# Patient Record
Sex: Male | Born: 1965
Health system: Southern US, Community
[De-identification: ages and names within clinical notes are randomized; demographics above are authoritative.]

## PROBLEM LIST (undated history)

## (undated) DIAGNOSIS — N529 Male erectile dysfunction, unspecified: Secondary | ICD-10-CM

## (undated) DIAGNOSIS — N201 Calculus of ureter: Secondary | ICD-10-CM

## (undated) HISTORY — DX: Male erectile dysfunction, unspecified: N52.9

## (undated) HISTORY — PX: TONSILLECTOMY AND ADENOIDECTOMY: SHX28

---

## 2001-07-05 HISTORY — PX: COLONOSCOPY: SHX174

## 2007-07-06 HISTORY — PX: NASAL TURBINATE REDUCTION: SHX2072

## 2012-07-05 DIAGNOSIS — Z87442 Personal history of urinary calculi: Secondary | ICD-10-CM | POA: Insufficient documentation

## 2012-08-18 ENCOUNTER — Ambulatory Visit: Payer: BC Managed Care – PPO | Admitting: Family Medicine

## 2012-10-16 ENCOUNTER — Encounter: Payer: Self-pay | Admitting: Family Medicine

## 2012-10-16 ENCOUNTER — Ambulatory Visit (INDEPENDENT_AMBULATORY_CARE_PROVIDER_SITE_OTHER): Payer: BC Managed Care – PPO | Admitting: Family Medicine

## 2012-10-16 VITALS — BP 110/76 | HR 72 | Temp 97.7°F | Ht 73.0 in | Wt 198.2 lb

## 2012-10-16 DIAGNOSIS — Z800 Family history of malignant neoplasm of digestive organs: Secondary | ICD-10-CM | POA: Insufficient documentation

## 2012-10-16 DIAGNOSIS — Z87442 Personal history of urinary calculi: Secondary | ICD-10-CM

## 2012-10-16 NOTE — Assessment & Plan Note (Signed)
Last colonoscopy 2007 WNL per pt. Will recommend rpt at age 47yo.

## 2012-10-16 NOTE — Patient Instructions (Signed)
Good to meet you today, call us with questions. Return as needed or in August/Sept for fasting blood work and afterwards for physical.

## 2012-10-16 NOTE — Progress Notes (Signed)
Subjective:    Patient ID: Clarence Moore, male    DOB: 02/03/1966, 47 y.o.   MRN: [id number]  HPI CC: new pt to establish  Prior saw [facility].  Overall very healthy.  No concerns today.  Sees ENT in [facility].  H/o recurrent sinus infections.  S/p nasal turbinate reduction.  Significant improvement in recurrent injections and subjective improvement in breathing.  Sees Dr. Margo Aye derm - h/o sun damage on skin of back as child.  Preventative: Due for physical in fall. Colonoscopy 2007 for fmhx.  Normal. Tetanus - unsure. Flu shot - has not received recently.  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  Past histories reviewed and updated if relevant as below. There is no problem list on file for this patient.  Past Medical History  Diagnosis Date  . History of kidney stones 07/2012  . History of chicken pox    Past Surgical History  Procedure Laterality Date  . Tonsillectomy and adenoidectomy    . Nasal turbinate reduction  2009  . Colonoscopy  2006    WNL [facility])   History  Substance Use Topics  . Smoking status: Never Smoker   . Smokeless tobacco: Never Used  . Alcohol Use: No   Family History  Problem Relation Age of Onset  . Cancer Paternal Grandmother 7    colon  . Colon polyps Paternal Grandfather   . Colon polyps Father   . Cancer Father     skin  . Cancer Mother 71    lung, non small cell (smoker)  . Diabetes Neg Hx   . CAD Neg Hx   . Stroke Neg Hx   . Cancer Father     esophageal   Allergies  Allergen Reactions  . Penicillins Anaphylaxis   No current outpatient prescriptions on file prior to visit.   No current facility-administered medications on file prior to visit.     Review of  Systems  Constitutional: Negative for fever, chills, activity change, appetite change, fatigue and unexpected weight change.  HENT: Negative for hearing loss and neck pain.   Eyes: Negative for visual disturbance.  Respiratory: Negative for cough, chest tightness, shortness of breath and wheezing.   Cardiovascular: Negative for chest pain, palpitations and leg swelling.  Gastrointestinal: Negative for nausea, vomiting, abdominal pain, diarrhea, constipation, blood in stool and abdominal distention.  Genitourinary: Negative for hematuria and difficulty urinating.  Musculoskeletal: Negative for myalgias and arthralgias.  Skin: Negative for rash.  Neurological: Negative for dizziness, seizures, syncope and headaches.  Hematological: Does not bruise/bleed easily.  Psychiatric/Behavioral: Negative for dysphoric mood. The patient is not nervous/anxious.        Objective:   Physical Exam  Nursing note and vitals reviewed. Constitutional: He is oriented to person, place, and time. He appears well-developed and well-nourished. No distress.  HENT:  Head: Normocephalic and atraumatic.  Right Ear: External ear normal.  Left Ear: External ear normal.  Nose: Nose normal.  Mouth/Throat: Oropharynx is clear and moist. No oropharyngeal exudate.  Eyes: Conjunctivae and EOM are normal. Pupils are equal, round, and reactive to light. No scleral icterus.  Neck: Normal range of motion. Neck supple. No thyromegaly present.  Cardiovascular: Normal rate, regular rhythm, normal heart sounds and intact distal pulses.   No murmur heard. Pulses:  Radial pulses are 2+ on the right side, and 2+ on the left side.  Pulmonary/Chest: Effort normal and breath sounds normal. No respiratory distress. He has no wheezes. He has no rales.  Musculoskeletal: Normal range of motion. He exhibits no edema.  Lymphadenopathy:    He has no cervical adenopathy.  Neurological: He is alert and oriented to person, place, and  time.  CN grossly intact, station and gait intact  Skin: Skin is warm and dry. No rash noted.  Multiple moles throughout body  Psychiatric: He has a normal mood and affect. His behavior is normal. Judgment and thought content normal.       Assessment & Plan:

## 2012-10-16 NOTE — Assessment & Plan Note (Signed)
Discussed methods to decrease risk of kidney stones.

## 2012-10-27 ENCOUNTER — Encounter: Payer: Self-pay | Admitting: Internal Medicine

## 2012-10-27 ENCOUNTER — Telehealth: Payer: Self-pay | Admitting: Family Medicine

## 2012-10-27 ENCOUNTER — Ambulatory Visit (INDEPENDENT_AMBULATORY_CARE_PROVIDER_SITE_OTHER): Payer: BC Managed Care – PPO | Admitting: Internal Medicine

## 2012-10-27 VITALS — BP 110/70 | HR 80 | Temp 98.0°F | Wt 200.0 lb

## 2012-10-27 DIAGNOSIS — J019 Acute sinusitis, unspecified: Secondary | ICD-10-CM

## 2012-10-27 MED ORDER — HYDROCODONE-HOMATROPINE 5-1.5 MG/5ML PO SYRP: 5.0000 mL | ORAL_SOLUTION | Freq: Every evening | ORAL | Status: DC | PRN

## 2012-10-27 MED ORDER — AZITHROMYCIN 250 MG PO TABS: ORAL_TABLET | ORAL | Status: DC

## 2012-10-27 NOTE — Telephone Encounter (Signed)
Will evaluate at OV 

## 2012-10-27 NOTE — Assessment & Plan Note (Signed)
Clearly has secondary bacterial infection PCN allergic Will give z-pak---if doesn't respond, would try clinda

## 2012-10-27 NOTE — Telephone Encounter (Signed)
Patient Information:  Caller Name: [place]  Phone: [phone]  Patient: Clarence, Moore  Gender: Male  DOB: 1966-03-26  Age: 47 Years  PCP: Crawford Givens Clelia Croft) [facility])  Office Follow Up:  Does the office need to follow up with this patient?: No  Instructions For The Office: N/A  RN Note:  pt also reports  chest pain with coughing  Symptoms  Reason For Call & Symptoms: chest congestion for 1 week; productive cough x 3 days  Reviewed Health History In EMR: Yes  Reviewed Medications In EMR: Yes  Reviewed Allergies In EMR: Yes  Reviewed Surgeries / Procedures: Yes  Date of Onset of Symptoms: 10/24/2012  Treatments Tried: Zyrtec  Treatments Tried Worked: No  Guideline(s) Used:  Sinus Pain and Congestion  Disposition Per Guideline:   See Today or Tomorrow in Office  Reason For Disposition Reached:   Sinus congestion (pressure, fullness) present > 10 days  Advice Given:  N/A  Patient Will Follow Care Advice:  YES  Appointment Scheduled:  10/27/2012 10:15:00 Appointment Scheduled Provider:  [facility])  (pt was not sure who his primary provider was, last visit was with Dr Reece Agar.  Dr Reece Agar is unavailable today, so appt made with first available provider)

## 2012-10-27 NOTE — Progress Notes (Signed)
  Subjective:    Patient ID: Clarence Moore, male    DOB: Dec 29, 1965, 47 y.o.   MRN: [id number]  HPI Started with typical allergy symptoms Using zyrtec  Now for the last 3-4 days Now with bad right frontal pain Can't sleep due to persistent harsh cough Heaviness and tight feeling in upper chest with cough  Head congestion with post nasal drip Copious purulent sputum with cough in AM  No fever Slight SOB feeling ("like I have a weight") Slight night wheezing  No other meds besides the zyrtec Uses sinus rinse  Current Outpatient Prescriptions on File Prior to Visit  Medication Sig Dispense Refill  . cetirizine (ZYRTEC) 10 MG tablet Take 10 mg by mouth daily as needed for allergies.       No current facility-administered medications on file prior to visit.    Allergies  Allergen Reactions  . Penicillins Anaphylaxis    Past Medical History  Diagnosis Date  . History of kidney stones 07/2012  . History of chicken pox     Past Surgical History  Procedure Laterality Date  . Tonsillectomy and adenoidectomy    . Nasal turbinate reduction  2009  . Colonoscopy  2006    WNL ([place])    Family History  Problem Relation Age of Onset  . Cancer Paternal Grandmother 57    colon  . Colon polyps Paternal Grandfather   . Colon polyps Father   . Cancer Mother 16    lung, non small cell (smoker)  . Diabetes Neg Hx   . CAD Neg Hx   . Stroke Neg Hx   . Cancer Father     skin/esophageal  . Alzheimer's disease Paternal Grandfather     History   Social History  . Marital Status: Married    Spouse Name: N/A    Number of Children: N/A  . Years of Education: N/A   Occupational History  . Not on file.   Social History Main Topics  . Smoking status: Never Smoker   . Smokeless tobacco: Never Used  . Alcohol Use: No  . Drug Use: No  . Sexually Active: Not on file   Other Topics Concern  . Not on file   Social History Narrative   Caffeine: 2 cups coffee/day   Lives  with wife, daughter, outdoor pets   Occupation: distribution Research scientist (life sciences)   Edu: college   Activity: cuts wood, dirtbike, walks at work   Diet: good water, fruits/vegetables daily   Review of Systems No rash No vomiting or diarrhea Appetite is okay    Objective:   Physical Exam  Constitutional: He appears well-developed and well-nourished. No distress.  HENT:  TMs normal Mild right frontal tenderness Slight pharyngeal injection without exudate Marked nasal inflammation and some thick mucus  Neck: Normal range of motion. Neck supple.  Pulmonary/Chest: Effort normal and breath sounds normal. No respiratory distress. He has no wheezes. He has no rales.  Lymphadenopathy:    He has no cervical adenopathy.          Assessment & Plan:

## 2012-11-02 DIAGNOSIS — N201 Calculus of ureter: Secondary | ICD-10-CM

## 2012-11-02 HISTORY — PX: LITHOTRIPSY: SUR834

## 2012-11-02 HISTORY — DX: Calculus of ureter: N20.1

## 2012-11-10 ENCOUNTER — Other Ambulatory Visit: Payer: Self-pay | Admitting: Family Medicine

## 2012-11-10 ENCOUNTER — Ambulatory Visit (INDEPENDENT_AMBULATORY_CARE_PROVIDER_SITE_OTHER): Payer: BC Managed Care – PPO | Admitting: Family Medicine

## 2012-11-10 ENCOUNTER — Ambulatory Visit: Payer: BC Managed Care – PPO | Admitting: Internal Medicine

## 2012-11-10 ENCOUNTER — Ambulatory Visit (INDEPENDENT_AMBULATORY_CARE_PROVIDER_SITE_OTHER)
Admission: RE | Admit: 2012-11-10 | Discharge: 2012-11-10 | Disposition: A | Discharge status: Home / Self Care | Payer: BC Managed Care – PPO | Source: Ambulatory Visit | Attending: Family Medicine | Admitting: Family Medicine

## 2012-11-10 ENCOUNTER — Encounter: Payer: Self-pay | Admitting: Family Medicine

## 2012-11-10 DIAGNOSIS — R1030 Lower abdominal pain, unspecified: Secondary | ICD-10-CM | POA: Insufficient documentation

## 2012-11-10 DIAGNOSIS — R109 Unspecified abdominal pain: Secondary | ICD-10-CM

## 2012-11-10 DIAGNOSIS — N132 Hydronephrosis with renal and ureteral calculous obstruction: Secondary | ICD-10-CM

## 2012-11-10 LAB — POCT URINALYSIS DIPSTICK
Bilirubin, UA: NEGATIVE
Ketones, UA: NEGATIVE
Spec Grav, UA: 1.01
pH, UA: 8

## 2012-11-10 LAB — BASIC METABOLIC PANEL
BUN: 22 mg/dL (ref 6–23)
Chloride: 104 mEq/L (ref 96–112)
GFR: 65.12 mL/min (ref >60.00)
Potassium: 3.9 mEq/L (ref 3.5–5.1)
Sodium: 138 mEq/L (ref 135–145)

## 2012-11-10 LAB — CBC WITH DIFFERENTIAL/PLATELET
Basophils Relative: 0.3 % (ref 0.0–3.0)
Eosinophils Relative: 0.4 % (ref 0.0–5.0)
HCT: 40.8 % (ref 39.0–52.0)
Lymphs Abs: 1.4 10*3/uL (ref 0.7–4.0)
MCV: 90.1 fl (ref 78.0–100.0)
Monocytes Absolute: 0.5 10*3/uL (ref 0.1–1.0)
Monocytes Relative: 3.6 % (ref 3.0–12.0)
Platelets: 303 10*3/uL (ref 150.0–400.0)
RBC: 4.54 Mil/uL (ref 4.22–5.81)
WBC: 13.9 10*3/uL — ABNORMAL HIGH (ref 4.5–10.5)

## 2012-11-10 MED ORDER — CIPROFLOXACIN HCL 500 MG PO TABS: 500.0000 mg | ORAL_TABLET | Freq: Two times a day (BID) | ORAL | Status: DC

## 2012-11-10 MED ORDER — TAMSULOSIN HCL 0.4 MG PO CAPS: 0.4000 mg | ORAL_CAPSULE | Freq: Every day | ORAL | Status: DC

## 2012-11-10 MED ORDER — KETOROLAC TROMETHAMINE 30 MG/ML IJ SOLN: 30.0000 mg | Freq: Once | INTRAMUSCULAR | Status: DC

## 2012-11-10 MED ORDER — NAPROXEN 500 MG PO TABS: ORAL_TABLET | ORAL | Status: DC

## 2012-11-10 MED ORDER — KETOROLAC TROMETHAMINE 30 MG/ML IM SOLN: 30.0000 mg | Freq: Once | INTRAMUSCULAR | Status: DC

## 2012-11-10 MED ORDER — PROMETHAZINE HCL 25 MG RE SUPP: 25.0000 mg | Freq: Four times a day (QID) | RECTAL | Status: DC | PRN

## 2012-11-10 MED ORDER — PROMETHAZINE HCL 25 MG PO TABS: 25.0000 mg | ORAL_TABLET | Freq: Three times a day (TID) | ORAL | Status: DC | PRN

## 2012-11-10 MED ORDER — KETOROLAC TROMETHAMINE 30 MG/ML IJ SOLN
30.0000 mg | Freq: Once | INTRAMUSCULAR | Status: AC
  Administered 2012-11-10: 30 mg via INTRAMUSCULAR

## 2012-11-10 NOTE — Patient Instructions (Addendum)
Urine checked today. Shot of toradol 30mg  today. Sent home with naprosyn twice daily with food for 5 days, flomax daily for 5-7 days, and phenergan for nausea as needed. May use vicodin for breakthrough pain Blood work today. Pass by Marion's office for CT scan today if able. If worsening pain instead of improving with above, or if fever, persistent vomiting, or inability to void, please go to ER. Return to see me early next week if not improved with above.

## 2012-11-10 NOTE — Progress Notes (Signed)
  Subjective:    Patient ID: Clarence Moore, male    DOB: 05-May-1966, 47 y.o.   MRN: [id number]  HPI CC: inability to void  H/o kidney stones, last was 1 month ago but never passed stone.    Started feeling ill at 8:30am.  Lower abd pain, twisting pain, penile pain.  Unable to void.  Drinking lots of water.  Vomited prior to arrival x1.  Mild left flank pain.  Last void was 4:30am.  No fevers/chills.  No diarrhea.  Has never seen urologist.  No h/o uro imaging.  No results found for this basename: CREATININE   Past Medical History  Diagnosis Date  . History of kidney stones 07/2012  . History of chicken pox    Past Surgical History  Procedure Laterality Date  . Tonsillectomy and adenoidectomy    . Nasal turbinate reduction  2009  . Colonoscopy  2006    WNL ([place])    Review of Systems Per HPI    Objective:   Physical Exam  Nursing note and vitals reviewed. Constitutional: He appears well-developed and well-nourished. No distress.  uncomfortable  Abdominal: Soft. Normal appearance and bowel sounds are normal. He exhibits no distension and no mass. There is no hepatosplenomegaly. There is tenderness in the right lower quadrant, suprapubic area and left lower quadrant. There is no rigidity, no rebound, no guarding, no CVA tenderness and negative Murphy's sign.  Musculoskeletal: He exhibits no edema.  Skin: He is not diaphoretic.       Assessment & Plan:

## 2012-11-10 NOTE — Assessment & Plan Note (Addendum)
In h/o kidney stones, associated with trouble voiding and hematuria, nausea. UA - mod blood, small LE.  Micro: mainly RBC, no WBC.  Doubt infection but will send UCx to help r/o Check CT without contrast to eval size of kidney stones and r/p obstruction.. Treat with shot of toradol, then home with naprosyn, phenergan, and flomax.  Check blood work today to help r/o infection/obstruction. Red flags to seek ER discussed. Close f/u merited.  Pt agrees with plan.

## 2012-11-13 ENCOUNTER — Other Ambulatory Visit: Payer: Self-pay | Admitting: Urology

## 2012-11-16 ENCOUNTER — Encounter (HOSPITAL_BASED_OUTPATIENT_CLINIC_OR_DEPARTMENT_OTHER): Payer: Self-pay | Admitting: *Deleted

## 2012-11-17 ENCOUNTER — Encounter (HOSPITAL_BASED_OUTPATIENT_CLINIC_OR_DEPARTMENT_OTHER): Payer: Self-pay | Admitting: *Deleted

## 2012-11-17 NOTE — Progress Notes (Signed)
NPO AFTER MN WITH EXCEPTION WATER/ GATORADE UNTIL 0700. ARRIVES AT 1145. CURRENT LAB WORK FROM 11-10-2012 IN EPIC AND CHART. MAY TAKE OXYCODONE / PHENERGAN IF NEEDED W/ SIPS OF WATER AM OF SURG.

## 2012-11-20 ENCOUNTER — Encounter (HOSPITAL_BASED_OUTPATIENT_CLINIC_OR_DEPARTMENT_OTHER): Payer: Self-pay | Admitting: *Deleted

## 2012-11-20 ENCOUNTER — Ambulatory Visit (HOSPITAL_COMMUNITY): Payer: BC Managed Care – PPO

## 2012-11-20 ENCOUNTER — Ambulatory Visit (HOSPITAL_BASED_OUTPATIENT_CLINIC_OR_DEPARTMENT_OTHER): Payer: BC Managed Care – PPO | Admitting: Anesthesiology

## 2012-11-20 ENCOUNTER — Encounter (HOSPITAL_BASED_OUTPATIENT_CLINIC_OR_DEPARTMENT_OTHER)
Admission: RE | Disposition: A | Discharge status: Home / Self Care | Payer: Self-pay | Source: Ambulatory Visit | Attending: Urology

## 2012-11-20 ENCOUNTER — Ambulatory Visit (HOSPITAL_BASED_OUTPATIENT_CLINIC_OR_DEPARTMENT_OTHER)
Admission: RE | Admit: 2012-11-20 | Discharge: 2012-11-20 | Disposition: A | Discharge status: Home / Self Care | Payer: BC Managed Care – PPO | Source: Ambulatory Visit | Attending: Urology | Admitting: Urology

## 2012-11-20 ENCOUNTER — Encounter (HOSPITAL_BASED_OUTPATIENT_CLINIC_OR_DEPARTMENT_OTHER): Payer: Self-pay | Admitting: Anesthesiology

## 2012-11-20 DIAGNOSIS — Z880 Allergy status to penicillin: Secondary | ICD-10-CM | POA: Insufficient documentation

## 2012-11-20 DIAGNOSIS — N201 Calculus of ureter: Secondary | ICD-10-CM | POA: Insufficient documentation

## 2012-11-20 HISTORY — DX: Calculus of ureter: N20.1

## 2012-11-20 HISTORY — PX: CYSTOSCOPY WITH RETROGRADE PYELOGRAM, URETEROSCOPY AND STENT PLACEMENT: SHX5789

## 2012-11-20 HISTORY — PX: HOLMIUM LASER APPLICATION: SHX5852

## 2012-11-20 SURGERY — CYSTOURETEROSCOPY, WITH RETROGRADE PYELOGRAM AND STENT INSERTION
Anesthesia: General | Site: Ureter | Laterality: Left | Wound class: Clean Contaminated

## 2012-11-20 MED ORDER — LIDOCAINE HCL 2 % EX GEL
CUTANEOUS | Status: DC | PRN
  Administered 2012-11-20: 1 via URETHRAL

## 2012-11-20 MED ORDER — NEOSTIGMINE METHYLSULFATE 1 MG/ML IJ SOLN
INTRAMUSCULAR | Status: DC | PRN
  Administered 2012-11-20: 4 mg via INTRAVENOUS

## 2012-11-20 MED ORDER — LACTATED RINGERS IV SOLN
INTRAVENOUS | Status: DC
  Administered 2012-11-20 (×3): via INTRAVENOUS
  Filled 2012-11-20: qty 1000

## 2012-11-20 MED ORDER — PHENAZOPYRIDINE HCL 200 MG PO TABS
200.0000 mg | ORAL_TABLET | Freq: Three times a day (TID) | ORAL | Status: DC
  Administered 2012-11-20: 200 mg via ORAL
  Filled 2012-11-20: qty 1

## 2012-11-20 MED ORDER — OXYCODONE-ACETAMINOPHEN 5-325 MG PO TABS: 1.0000 | ORAL_TABLET | ORAL | Status: DC | PRN

## 2012-11-20 MED ORDER — ROCURONIUM BROMIDE 100 MG/10ML IV SOLN
INTRAVENOUS | Status: DC | PRN
  Administered 2012-11-20: 10 mg via INTRAVENOUS
  Administered 2012-11-20: 5 mg via INTRAVENOUS
  Administered 2012-11-20: 10 mg via INTRAVENOUS

## 2012-11-20 MED ORDER — SODIUM CHLORIDE 0.9 % IR SOLN
Status: DC | PRN
  Administered 2012-11-20: 6000 mL via INTRAVESICAL

## 2012-11-20 MED ORDER — HYOSCYAMINE SULFATE 0.125 MG SL SUBL
0.1250 mg | SUBLINGUAL_TABLET | SUBLINGUAL | Status: DC | PRN
  Filled 2012-11-20: qty 1

## 2012-11-20 MED ORDER — FENTANYL CITRATE 0.05 MG/ML IJ SOLN
INTRAMUSCULAR | Status: DC | PRN
  Administered 2012-11-20: 50 ug via INTRAVENOUS
  Administered 2012-11-20: 25 ug via INTRAVENOUS
  Administered 2012-11-20: 50 ug via INTRAVENOUS
  Administered 2012-11-20: 25 ug via INTRAVENOUS

## 2012-11-20 MED ORDER — LIDOCAINE HCL 4 % MT SOLN
OROMUCOSAL | Status: DC | PRN
  Administered 2012-11-20: 4 mL via TOPICAL

## 2012-11-20 MED ORDER — FENTANYL CITRATE 0.05 MG/ML IJ SOLN
25.0000 ug | INTRAMUSCULAR | Status: DC | PRN
  Filled 2012-11-20: qty 1

## 2012-11-20 MED ORDER — SUCCINYLCHOLINE CHLORIDE 20 MG/ML IJ SOLN
INTRAMUSCULAR | Status: DC | PRN
  Administered 2012-11-20: 200 mg via INTRAVENOUS

## 2012-11-20 MED ORDER — CIPROFLOXACIN IN D5W 400 MG/200ML IV SOLN
400.0000 mg | INTRAVENOUS | Status: AC
  Administered 2012-11-20: 400 mg via INTRAVENOUS
  Filled 2012-11-20: qty 200

## 2012-11-20 MED ORDER — ONDANSETRON HCL 4 MG/2ML IJ SOLN
INTRAMUSCULAR | Status: DC | PRN
  Administered 2012-11-20: 4 mg via INTRAVENOUS

## 2012-11-20 MED ORDER — MEPERIDINE HCL 25 MG/ML IJ SOLN
6.2500 mg | INTRAMUSCULAR | Status: DC | PRN
  Filled 2012-11-20: qty 1

## 2012-11-20 MED ORDER — IOHEXOL 350 MG/ML SOLN
INTRAVENOUS | Status: DC | PRN
  Administered 2012-11-20: 5 mL

## 2012-11-20 MED ORDER — HYOSCYAMINE SULFATE 0.125 MG PO TABS: 0.1250 mg | ORAL_TABLET | ORAL | Status: DC | PRN

## 2012-11-20 MED ORDER — LACTATED RINGERS IV SOLN
INTRAVENOUS | Status: DC
  Filled 2012-11-20: qty 1000

## 2012-11-20 MED ORDER — PROPOFOL 10 MG/ML IV BOLUS
INTRAVENOUS | Status: DC | PRN
  Administered 2012-11-20: 300 mg via INTRAVENOUS

## 2012-11-20 MED ORDER — CIPROFLOXACIN HCL 500 MG PO TABS: 500.0000 mg | ORAL_TABLET | Freq: Two times a day (BID) | ORAL | Status: DC

## 2012-11-20 MED ORDER — DEXAMETHASONE SODIUM PHOSPHATE 4 MG/ML IJ SOLN
INTRAMUSCULAR | Status: DC | PRN
  Administered 2012-11-20: 8 mg via INTRAVENOUS

## 2012-11-20 MED ORDER — OXYCODONE-ACETAMINOPHEN 5-325 MG PO TABS
1.0000 | ORAL_TABLET | ORAL | Status: DC | PRN
  Administered 2012-11-20: 1 via ORAL
  Filled 2012-11-20: qty 1

## 2012-11-20 MED ORDER — PHENAZOPYRIDINE HCL 100 MG PO TABS: 100.0000 mg | ORAL_TABLET | Freq: Three times a day (TID) | ORAL | Status: DC | PRN

## 2012-11-20 MED ORDER — PROMETHAZINE HCL 25 MG/ML IJ SOLN
6.2500 mg | INTRAMUSCULAR | Status: DC | PRN
  Filled 2012-11-20: qty 1

## 2012-11-20 MED ORDER — GLYCOPYRROLATE 0.2 MG/ML IJ SOLN
INTRAMUSCULAR | Status: DC | PRN
  Administered 2012-11-20: 0.6 mg via INTRAVENOUS

## 2012-11-20 MED ORDER — OXYBUTYNIN CHLORIDE 5 MG PO TABS: 5.0000 mg | ORAL_TABLET | Freq: Four times a day (QID) | ORAL | Status: DC | PRN

## 2012-11-20 MED ORDER — SENNOSIDES-DOCUSATE SODIUM 8.6-50 MG PO TABS: 1.0000 | ORAL_TABLET | Freq: Two times a day (BID) | ORAL | Status: DC

## 2012-11-20 MED ORDER — LIDOCAINE HCL (CARDIAC) 20 MG/ML IV SOLN
INTRAVENOUS | Status: DC | PRN
  Administered 2012-11-20: 75 mg via INTRAVENOUS

## 2012-11-20 MED ORDER — MIDAZOLAM HCL 5 MG/5ML IJ SOLN
INTRAMUSCULAR | Status: DC | PRN
  Administered 2012-11-20 (×2): 1 mg via INTRAVENOUS

## 2012-11-20 SURGICAL SUPPLY — 38 items
ADAPTER CATH URET PLST 4-6FR (CATHETERS) ×3 IMPLANT
BAG DRAIN URO-CYSTO SKYTR STRL (DRAIN) ×3 IMPLANT
BASKET LASER NITINOL 1.9FR (BASKET) IMPLANT
BASKET STNLS GEMINI 4WIRE 3FR (BASKET) IMPLANT
BASKET ZERO TIP NITINOL 2.4FR (BASKET) IMPLANT
BRUSH URET BIOPSY 3F (UROLOGICAL SUPPLIES) IMPLANT
CANISTER SUCT LVC 12 LTR MEDI- (MISCELLANEOUS) ×3 IMPLANT
CATH CLEAR GEL 3F BACKSTOP (CATHETERS) ×3 IMPLANT
CATH INTERMIT  6FR 70CM (CATHETERS) IMPLANT
CATH URET 5FR 28IN CONE TIP (BALLOONS)
CATH URET 5FR 28IN OPEN ENDED (CATHETERS) ×3 IMPLANT
CATH URET 5FR 70CM CONE TIP (BALLOONS) IMPLANT
CATH URET DUAL LUMEN 6-10FR 50 (CATHETERS) IMPLANT
CLOTH BEACON ORANGE TIMEOUT ST (SAFETY) ×3 IMPLANT
DRAPE CAMERA CLOSED 9X96 (DRAPES) ×3 IMPLANT
ELECT REM PT RETURN 9FT ADLT (ELECTROSURGICAL)
ELECTRODE REM PT RTRN 9FT ADLT (ELECTROSURGICAL) IMPLANT
GLOVE BIO SURGEON STRL SZ7 (GLOVE) ×3 IMPLANT
GLOVE ECLIPSE 7.0 STRL STRAW (GLOVE) ×3 IMPLANT
GLOVE INDICATOR 7.5 STRL GRN (GLOVE) ×3 IMPLANT
GOWN PREVENTION PLUS LG XLONG (DISPOSABLE) ×3 IMPLANT
GUIDEWIRE 0.038 PTFE COATED (WIRE) IMPLANT
GUIDEWIRE ANG ZIPWIRE 038X150 (WIRE) IMPLANT
GUIDEWIRE STR DUAL SENSOR (WIRE) ×3 IMPLANT
IV NS IRRIG 3000ML ARTHROMATIC (IV SOLUTION) ×6 IMPLANT
KIT BALLIN UROMAX 15FX10 (LABEL) IMPLANT
KIT BALLN UROMAX 15FX4 (MISCELLANEOUS) IMPLANT
KIT BALLN UROMAX 26 75X4 (MISCELLANEOUS)
LASER FIBER DISP (UROLOGICAL SUPPLIES) ×6 IMPLANT
PACK CYSTOSCOPY (CUSTOM PROCEDURE TRAY) ×3 IMPLANT
SET HIGH PRES BAL DIL (LABEL)
SHEATH ACCESS URETERAL 38CM (SHEATH) ×3 IMPLANT
SHEATH ACCESS URETERAL 54CM (SHEATH) IMPLANT
SHEATH URET ACCESS 12FR/35CM (UROLOGICAL SUPPLIES) IMPLANT
SHEATH URET ACCESS 12FR/55CM (UROLOGICAL SUPPLIES) IMPLANT
STENT CONTOUR 6FRX28X.038 (STENTS) IMPLANT
STENT URET 6FRX28 CONTOUR (STENTS) ×3 IMPLANT
SYRINGE IRR TOOMEY STRL 70CC (SYRINGE) ×3 IMPLANT

## 2012-11-20 NOTE — Op Note (Signed)
Urology Operative Report  Date of Procedure: 11/20/12  Surgeon: Natalia Leatherwood, MD Assistant:  None  Preoperative Diagnosis: Left distal ureter stone. Postoperative Diagnosis:  Same  Procedure(s): Cystoscopy Left ureteroscopy Laser lithotripsy Left ureter stent placement Fluoroscopy  Estimated blood loss: Minimal  Specimen: Stones sent to [facility] lab for analysis.  Drains: None  Complications: None  Findings: Left distal ureter stone with distal ureteral edema and stenosis.  History of present illness: Patient presents today for left distal ureter stone. He was having pain from the stone and elected intervention. He presents today for ureteroscopy.   Procedure in detail: After informed consent was obtained, the patient was taken to the operating room. They were placed in the supine position. SCDs were turned on and in place. IV antibiotics were infused, and general anesthesia was induced. A timeout was performed in which the correct patient, surgical site, and procedure were identified and agreed upon by the team.  The patient was placed in a dorsolithotomy position, making sure to pad all pertinent neurovascular pressure points. A belladonna and opium suppository was placed into the rectum. The genitals were prepped and draped in the usual sterile fashion.  I placed 10 cc of lidocaine jelly into the urethra. I then passed a rigid cystoscope into the bladder. The bladder was drained. I then fully distended the bladder with sterile fluid and evaluated the bladder in a systematic fashion to fully visualize the entire surface of the bladder. There were no tumors. Attention was turned to the left ureter orifice. It was cannulated with a sensor tip wire. This is placed up to the left renal pelvis on fluoroscopy. This was secured to the drape as a safety wire. The patient was paralyzed. I then advanced a semirigid ureteroscope through the urethra into the bladder and attempted to place  this into the distal ureter. This was very narrow and noted to be stenotic. The cystoscope was removed. I then placed the obturator to a 12/14 ureter access sheath over the safety wire under fluoroscopy. This was placed very gently and this passed with ease. This was passed only into the distal ureter. This was then removed and the safety wire was secured to the drape. I then reinserted the semirigid ureter scope and this time is able to pass this into the ureter and the stone was visualized. The stone was too large to remove. I then placed backstop gel beyond the stone under direct visualization. I then perform lithotripsy with a 200  holmium laser filament. I used the settings of 0.5 J and 20 Hz. Lithotripsy was used to break up stone into the fragments were small enough to be removed. I then used a 0 tip Nitinol basket to remove the large fragments from the bladder and the ureter. The smaller fragments I placed into the bladder. Due to the distal stenosis I felt it would be appropriate to place a ureter stent. The ureter scope was removed and the cystoscope was loaded onto the safety wire. A 6 x 28 double-J ureter stent was placed over the safety wire into the left renal pelvis. This was deployed with a good curl in the left renal pelvis on fluoroscopy and a good curl in the bladder. The bladder was then drained and the larger stone fragments were washed out of the bladder. I then placed an additional 10 cc of lidocaine jelly into the urethra. The string was then secured to the patient's penis with tape.  The patient was placed back in supine  position, anesthesia was reversed, and he was taken to the [facility] in stable condition. The stones will be sent to [facility] for chemical analysis. He will remove the stent himself early on Thursday morning.  All counts were correct at the end of the case.

## 2012-11-20 NOTE — Anesthesia Preprocedure Evaluation (Addendum)
Anesthesia Evaluation  Patient identified by MRN, date of birth, ID band Patient awake    Reviewed: Allergy & Precautions, H&P , NPO status , Patient's Chart, lab work & pertinent test results  Airway Mallampati: II TM Distance: >3 FB Neck ROM: full    Dental no notable dental hx.    Pulmonary neg pulmonary ROS,  breath sounds clear to auscultation  Pulmonary exam normal       Cardiovascular Exercise Tolerance: Good negative cardio ROS  Rhythm:regular Rate:Normal     Neuro/Psych negative neurological ROS  negative psych ROS   GI/Hepatic negative GI ROS, Neg liver ROS,   Endo/Other  negative endocrine ROS  Renal/GU negative Renal ROS  negative genitourinary   Musculoskeletal negative musculoskeletal ROS (+)   Abdominal   Peds negative pediatric ROS (+)  Hematology negative hematology ROS (+)   Anesthesia Other Findings   Reproductive/Obstetrics negative OB ROS                          Anesthesia Physical Anesthesia Plan  ASA: I  Anesthesia Plan: General LMA   Post-op Pain Management:    Induction: Intravenous  Airway Management Planned: LMA  Additional Equipment:   Intra-op Plan:   Post-operative Plan: Extubation in OR  Informed Consent: I have reviewed the patients History and Physical, chart, labs and discussed the procedure including the risks, benefits and alternatives for the proposed anesthesia with the patient or authorized representative who has indicated his/her understanding and acceptance.   Dental Advisory Given  Plan Discussed with: CRNA  Anesthesia Plan Comments:        Anesthesia Quick Evaluation

## 2012-11-20 NOTE — Anesthesia Postprocedure Evaluation (Signed)
  Anesthesia Post-op Note  Patient: Clarence Moore  Procedure(s) Performed: Procedure(s) (LRB): CYSTOSCOPY WITH RETROGRADE PYELOGRAM, URETEROSCOPY AND STENT PLACEMENT (Left) HOLMIUM LASER APPLICATION (Left)  Patient Location: PACU  Anesthesia Type: General  Level of Consciousness: awake and alert   Airway and Oxygen Therapy: Patient Spontanous Breathing  Post-op Pain: mild  Post-op Assessment: Post-op Vital signs reviewed, Patient's Cardiovascular Status Stable, Respiratory Function Stable, Patent Airway and No signs of Nausea or vomiting  Last Vitals:  Filed Vitals:   11/20/12 1515  BP: 123/70  Pulse: 58  Temp:   Resp: 15    Post-op Vital Signs: stable   Complications: No apparent anesthesia complications

## 2012-11-20 NOTE — Transfer of Care (Signed)
Immediate Anesthesia Transfer of Care Note  Patient: Clarence Moore  Procedure(s) Performed: Procedure(s) (LRB): CYSTOSCOPY WITH RETROGRADE PYELOGRAM, URETEROSCOPY AND STENT PLACEMENT (Left) HOLMIUM LASER APPLICATION (Left)  Patient Location: Patient transported to PACU with oxygen via face mask at 4 Liters / Min  Anesthesia Type: General  Level of Consciousness: awake and alert   Airway & Oxygen Therapy: Patient Spontanous Breathing and Patient connected to face mask oxygen  Post-op Assessment: Report given to PACU RN and Post -op Vital signs reviewed and stable  Post vital signs: Reviewed and stable  Dentition: Teeth and oropharynx remain in pre-op condition  Complications: No apparent anesthesia complications

## 2012-11-20 NOTE — H&P (Signed)
Urology History and Physical Exam  CC: Left ureter stone  HPI:  47 year old male presents today for left ureter stone. He plans on proceeding with cystoscopy, left ureteroscopy, laser lithotripsy, left retrograde pyelogram, possible left ureter stent placement. He began having symptoms of the stone in January 2014. This was his first stone episode. It is located on the left side. It is associated with sharp flank pain. It is not associated with fever. It is relieved with narcotics. CT scan 11/10/12 of the abdomen and pelvis revealed a left-sided ureter stone. It was in the distal ureter located close to the ureterovesical junction. It measured 0.9 x 0.6 x 0.8 cm. It was associated with proximal hydroureter. Urine culture from 11/10/12 was negative for growth. We have reviewed the risks, benefits, alternatives, and likelihood of achieving goals.  PMH: Past Medical History  Diagnosis Date  . Left ureteral calculus     PSH: Past Surgical History  Procedure Laterality Date  . Tonsillectomy and adenoidectomy  AGE 53  . Nasal turbinate reduction  2009  . Colonoscopy  2006    WNL ([place])    Allergies: Allergies  Allergen Reactions  . Penicillins Anaphylaxis    Medications: No prescriptions prior to admission     Social History: History   Social History  . Marital Status: Married    Spouse Name: N/A    Number of Children: N/A  . Years of Education: N/A   Occupational History  . Not on file.   Social History Main Topics  . Smoking status: Never Smoker   . Smokeless tobacco: Never Used  . Alcohol Use: No  . Drug Use: No  . Sexually Active: Not on file   Other Topics Concern  . Not on file   Social History Narrative   Caffeine: 2 cups coffee/day   Lives with wife, daughter, outdoor pets   Occupation: distribution Research scientist (life sciences)   Edu: college   Activity: cuts wood, dirtbike, walks at work   Diet: good water, fruits/vegetables daily    Family  History: Family History  Problem Relation Age of Onset  . Cancer Paternal Grandmother 53    colon  . Colon polyps Paternal Grandfather   . Colon polyps Father   . Cancer Mother 32    lung, non small cell (smoker)  . Diabetes Neg Hx   . CAD Neg Hx   . Stroke Neg Hx   . Cancer Father     skin/esophageal  . Alzheimer's disease Paternal Grandfather     Review of Systems: Positive: Flank pain. Negative: Fever, chest pain, or SOB.  A further 10 point review of systems was negative except what is listed in the HPI.  Physical Exam: Filed Vitals:   11/20/12 1203  BP: 109/68  Pulse: 58  Temp: 97 F (36.1 C)  Resp: 20    General: No acute distress.  Awake. Head:  Normocephalic.  Atraumatic. ENT:  EOMI.  Mucous membranes moist Neck:  Supple.  No lymphadenopathy. CV:  S1 present. S2 present. Regular rate. Pulmonary: Equal effort bilaterally.  Clear to auscultation bilaterally. Abdomen: Soft.  Non- tender to palpation. Skin:  Normal turgor.  No visible rash. Extremity: No gross deformity of bilateral upper extremities.  No gross deformity of    bilateral lower extremities. Neurologic: Alert. Appropriate mood.    Studies:  No results found for this basename: HGB, WBC, PLT,  in the last 72 hours  No results found for this basename: NA, K, CL, CO2, BUN, CREATININE,  CALCIUM, MAGNESIUM, GFRNONAA, GFRAA,  in the last 72 hours   No results found for this basename: PT, INR, APTT,  in the last 72 hours   No components found with this basename: ABG,     Assessment:  Left distal ureter stone.  Plan: To OR for cystoscopy, left ureteroscopy, laser lithotripsy, left retrograde pyelogram, possible left ureter stent placement.

## 2012-11-20 NOTE — Anesthesia Procedure Notes (Signed)
Procedure Name: Intubation Date/Time: 11/20/2012 1:22 PM Performed by: Fran Lowes Pre-anesthesia Checklist: Patient identified, Emergency Drugs available, Suction available and Patient being monitored Patient Re-evaluated:Patient Re-evaluated prior to inductionOxygen Delivery Method: Circle System Utilized Preoxygenation: Pre-oxygenation with 100% oxygen Intubation Type: IV induction Ventilation: Mask ventilation without difficulty Laryngoscope Size: Mac and 4 Grade View: Grade I Tube type: Oral Tube size: 8.0 mm Number of attempts: 1 Airway Equipment and Method: stylet,  oral airway,  LTA kit utilized and Oral airway Placement Confirmation: ETT inserted through vocal cords under direct vision,  positive ETCO2 and breath sounds checked- equal and bilateral Secured at: 22 cm Tube secured with: Tape Dental Injury: Teeth and Oropharynx as per pre-operative assessment

## 2012-11-21 ENCOUNTER — Encounter (HOSPITAL_BASED_OUTPATIENT_CLINIC_OR_DEPARTMENT_OTHER): Payer: Self-pay | Admitting: Urology

## 2012-11-27 ENCOUNTER — Encounter: Payer: Self-pay | Admitting: Family Medicine

## 2013-02-25 ENCOUNTER — Other Ambulatory Visit: Payer: Self-pay | Admitting: Family Medicine

## 2013-02-25 DIAGNOSIS — Z0000 Encounter for general adult medical examination without abnormal findings: Secondary | ICD-10-CM

## 2013-02-27 ENCOUNTER — Other Ambulatory Visit (INDEPENDENT_AMBULATORY_CARE_PROVIDER_SITE_OTHER): Payer: BC Managed Care – PPO

## 2013-02-27 DIAGNOSIS — Z0000 Encounter for general adult medical examination without abnormal findings: Secondary | ICD-10-CM

## 2013-02-27 LAB — BASIC METABOLIC PANEL
Calcium: 9.3 mg/dL (ref 8.4–10.5)
GFR: 92.33 mL/min (ref >60.00)
Glucose, Bld: 94 mg/dL (ref 70–99)
Potassium: 4.3 mEq/L (ref 3.5–5.1)
Sodium: 138 mEq/L (ref 135–145)

## 2013-02-27 LAB — LIPID PANEL
HDL: 51.8 mg/dL (ref >39.00)
Total CHOL/HDL Ratio: 3
VLDL: 11.8 mg/dL (ref 0.0–40.0)

## 2013-03-06 ENCOUNTER — Encounter: Payer: Self-pay | Admitting: Family Medicine

## 2013-03-06 ENCOUNTER — Ambulatory Visit (INDEPENDENT_AMBULATORY_CARE_PROVIDER_SITE_OTHER): Payer: BC Managed Care – PPO | Admitting: Family Medicine

## 2013-03-06 VITALS — BP 110/74 | HR 84 | Temp 98.4°F | Ht 73.0 in | Wt 200.5 lb

## 2013-03-06 DIAGNOSIS — Z8042 Family history of malignant neoplasm of prostate: Secondary | ICD-10-CM

## 2013-03-06 DIAGNOSIS — Z0000 Encounter for general adult medical examination without abnormal findings: Secondary | ICD-10-CM | POA: Insufficient documentation

## 2013-03-06 NOTE — Patient Instructions (Signed)
Good to see you today, call us with questions. We'll start checking prostate at that time. Let us know when you'd like referral for colonoscopy. Return as needed or in 1 year for next physical.

## 2013-03-06 NOTE — Assessment & Plan Note (Signed)
Preventative protocols reviewed and updated unless pt declined. Discussed healthy diet and lifestyle. Pt desires to start prostate cancer screening next year given fmhx. He will call us when he would like to set up colonoscopy in the next year (near [place])

## 2013-03-06 NOTE — Progress Notes (Signed)
Subjective:    Patient ID: Clarence Moore, male    DOB: [birth year], 47 y.o.   MRN: [id number]  HPI CC: CPE  Preventative:  Colonoscopy 2007 for fmhx. Normal. Interested in getting done next year.  Would like to establish (near [place]). Prostate cancer - family history (father with prostate issues since age 47s) - pt desires to start screening next year. Tetanus - none recently.  Tdap today. Flu shot - declines  Seat belt use discussed. Sunscreen use discussed.  No sunburns in last year.  Caffeine: 2 cups coffee/day  Lives with wife, daughter, outdoor pets  Occupation: distribution Research scientist (life sciences)  Edu: college  Activity: cuts wood, dirtbike, walks at work  Diet: good water, fruits/vegetables daily  Medications and allergies reviewed and updated in chart.  Past histories reviewed and updated if relevant as below. Patient Active Problem List   Diagnosis Date Noted  . Lower abdominal pain 11/10/2012  . Family history of colon cancer 10/16/2012  . History of kidney stones 07/05/2012   Past Medical History  Diagnosis Date  . Left ureteral calculus 11/2012    9mm, s/p stent placement Margarita Grizzle)   Past Surgical History  Procedure Laterality Date  . Tonsillectomy and adenoidectomy  AGE 43  . Nasal turbinate reduction  2009  . Colonoscopy  2006    WNL ([place])  . Lithotripsy  11/2012    [facility]  . Cystoscopy with retrograde pyelogram, ureteroscopy and stent placement Left 11/20/2012    Procedure: CYSTOSCOPY WITH RETROGRADE PYELOGRAM, URETEROSCOPY AND STENT PLACEMENT;  Surgeon: Milford Cage, MD;  Location: [facility];  Service: Urology;  Laterality: Left;  CYSTO, (L) URETEROSCOPY, LASER LITHO, LEFT RETROGRADE PYELOGRAM, POSSIBLE LEFT URETER STENT,    . Holmium laser application Left 11/20/2012    Procedure: HOLMIUM LASER APPLICATION;  Surgeon: Milford Cage, MD;  Location: [facility];  Service: Urology;  Laterality:  Left;   History  Substance Use Topics  . Smoking status: Never Smoker   . Smokeless tobacco: Never Used  . Alcohol Use: No   Family History  Problem Relation Age of Onset  . Cancer Paternal Grandmother 48    colon  . Colon polyps Paternal Grandfather   . Colon polyps Father   . Cancer Mother 68    lung, non small cell (smoker)  . Diabetes Neg Hx   . CAD Neg Hx   . Stroke Neg Hx   . Cancer Father     skin/esophageal  . Alzheimer's disease Paternal Grandfather    Allergies  Allergen Reactions  . Penicillins Anaphylaxis   Current Outpatient Prescriptions on File Prior to Visit  Medication Sig Dispense Refill  . cetirizine (ZYRTEC) 10 MG tablet Take 10 mg by mouth daily as needed for allergies.       No current facility-administered medications on file prior to visit.    Review of Systems  Constitutional: Negative for fever, chills, activity change, appetite change, fatigue and unexpected weight change.  HENT: Negative for hearing loss and neck pain.   Eyes: Negative for visual disturbance.  Respiratory: Negative for cough, chest tightness, shortness of breath and wheezing.   Cardiovascular: Negative for chest pain, palpitations and leg swelling.  Gastrointestinal: Negative for nausea, vomiting, abdominal pain, diarrhea, constipation, blood in stool and abdominal distention.  Genitourinary: Negative for hematuria and difficulty urinating.  Musculoskeletal: Negative for myalgias and arthralgias.  Skin: Negative for rash.  Neurological: Negative for dizziness, seizures, syncope and headaches.  Hematological: Negative for  adenopathy. Does not bruise/bleed easily.  Psychiatric/Behavioral: Negative for dysphoric mood. The patient is not nervous/anxious.        Objective:   Physical Exam  Nursing note and vitals reviewed. Constitutional: He is oriented to person, place, and time. He appears well-developed and well-nourished. No distress.  HENT:  Head: Normocephalic and  atraumatic.  Right Ear: Hearing, tympanic membrane, external ear and ear canal normal.  Left Ear: Hearing, tympanic membrane, external ear and ear canal normal.  Nose: Nose normal.  Mouth/Throat: Oropharynx is clear and moist. No oropharyngeal exudate.  Eyes: Conjunctivae and EOM are normal. Pupils are equal, round, and reactive to light. No scleral icterus.  Neck: Normal range of motion. Neck supple. No thyromegaly present.  Cardiovascular: Normal rate, regular rhythm, normal heart sounds and intact distal pulses.   No murmur heard. Pulses:      Radial pulses are 2+ on the right side, and 2+ on the left side.  Pulmonary/Chest: Effort normal and breath sounds normal. No respiratory distress. He has no wheezes. He has no rales.  Abdominal: Soft. Bowel sounds are normal. He exhibits no distension and no mass. There is no tenderness. There is no rebound and no guarding.  Musculoskeletal: Normal range of motion. He exhibits no edema.  Lymphadenopathy:    He has no cervical adenopathy.  Neurological: He is alert and oriented to person, place, and time.  CN grossly intact, station and gait intact  Skin: Skin is warm and dry. No rash noted.  Multiple moles throughout body  Psychiatric: He has a normal mood and affect. His behavior is normal. Judgment and thought content normal.       Assessment & Plan:

## 2013-04-02 ENCOUNTER — Encounter: Payer: Self-pay | Admitting: Family Medicine

## 2013-05-10 ENCOUNTER — Other Ambulatory Visit: Payer: Self-pay

## 2013-06-30 ENCOUNTER — Encounter: Payer: Self-pay | Admitting: Internal Medicine

## 2013-06-30 ENCOUNTER — Ambulatory Visit (INDEPENDENT_AMBULATORY_CARE_PROVIDER_SITE_OTHER): Payer: BC Managed Care – PPO | Admitting: Internal Medicine

## 2013-06-30 VITALS — BP 110/82 | HR 78 | Temp 100.0°F | Ht 73.0 in | Wt 203.0 lb

## 2013-06-30 DIAGNOSIS — J019 Acute sinusitis, unspecified: Secondary | ICD-10-CM

## 2013-06-30 MED ORDER — AZITHROMYCIN 250 MG PO TABS: ORAL_TABLET | ORAL | Status: DC

## 2013-06-30 NOTE — Assessment & Plan Note (Signed)
Mild to mod, for antibx course,  to f/u any worsening symptoms or concerns 

## 2013-06-30 NOTE — Progress Notes (Signed)
Pre-visit discussion using our clinic review tool. No additional management support is needed unless otherwise documented below in the visit note.  

## 2013-06-30 NOTE — Patient Instructions (Signed)
Please take all new medication as prescribed  You can also take Delsym OTC for cough, and/or Mucinex (or it's generic off brand) for congestion, and tylenol as needed for pain.  Please continue all other medications as before Please have the pharmacy call with any other refills you may need.  Please remember to sign up for My Chart if you have not done so, as this will be important to you in the future with finding out test results, communicating by private email, and scheduling acute appointments online when needed.

## 2013-06-30 NOTE — Progress Notes (Signed)
   Subjective:    Patient ID: Clarence Moore, male    DOB: 1966/06/04, 47 y.o.   MRN: [id number]  HPI   Here with 2-3 days acute onset fever, facial pain, pressure, headache, general weakness and malaise, and greenish d/c, with mild ST and cough, but pt denies chest pain, wheezing, increased sob or doe, orthopnea, PND, increased LE swelling, palpitations, dizziness or syncope. Past Medical History  Diagnosis Date  . Left ureteral calculus 11/2012    9mm, s/p stent placement Margarita Grizzle)  . ED (erectile dysfunction)     per uro   Past Surgical History  Procedure Laterality Date  . Tonsillectomy and adenoidectomy  AGE 63  . Nasal turbinate reduction  2009  . Colonoscopy  2006    WNL ([place])  . Lithotripsy  11/2012    [facility]  . Cystoscopy with retrograde pyelogram, ureteroscopy and stent placement Left 11/20/2012    Procedure: CYSTOSCOPY WITH RETROGRADE PYELOGRAM, URETEROSCOPY AND STENT PLACEMENT;  Surgeon: Milford Cage, MD;  Location: [facility];  Service: Urology;  Laterality: Left;  CYSTO, (L) URETEROSCOPY, LASER LITHO, LEFT RETROGRADE PYELOGRAM, POSSIBLE LEFT URETER STENT,    . Holmium laser application Left 11/20/2012    Procedure: HOLMIUM LASER APPLICATION;  Surgeon: Milford Cage, MD;  Location: [facility];  Service: Urology;  Laterality: Left;    reports that he has never smoked. He has never used smokeless tobacco. He reports that he does not drink alcohol or use illicit drugs. family history includes Alzheimer's disease in his paternal grandfather; Cancer in his father and paternal grandfather; Cancer (age of onset: 73) in his mother and paternal grandmother; Colon polyps in his father and paternal grandfather; Other (age of onset: 65) in his father. There is no history of Diabetes, CAD, or Stroke. Allergies  Allergen Reactions  . Penicillins Anaphylaxis   Current Outpatient Prescriptions on File Prior to Visit  Medication Sig  Dispense Refill  . cetirizine (ZYRTEC) 10 MG tablet Take 10 mg by mouth daily as needed for allergies.       No current facility-administered medications on file prior to visit.    Review of Systems All otherwise neg per pt     Objective:   Physical Exam BP 110/82  Pulse 78  Temp(Src) 100 F (37.8 C) (Oral)  Ht 6\' 1"  (1.854 m)  Wt 203 lb (92.08 kg)  BMI 26.79 kg/m2  SpO2 97% VS noted, mild ill Constitutional: Pt appears well-developed and well-nourished.  HENT: Head: NCAT.  Right Ear: External ear normal.  Left Ear: External ear normal.  Bilat tm's with mild erythema.  Max sinus areas mild tender.  Pharynx with mild erythema, no exudate Eyes: Conjunctivae and EOM are normal. Pupils are equal, round, and reactive to light.  Neck: Normal range of motion. Neck supple.  Cardiovascular: Normal rate and regular rhythm.   Pulmonary/Chest: Effort normal and breath sounds normal.  Neurological: Pt is alert. Not confused  Skin: Skin is warm. No erythema.  Psychiatric: Pt behavior is normal. Thought content normal.      Assessment & Plan:

## 2013-07-05 HISTORY — PX: NECK SURGERY: SHX720

## 2013-07-12 ENCOUNTER — Ambulatory Visit (INDEPENDENT_AMBULATORY_CARE_PROVIDER_SITE_OTHER): Payer: BC Managed Care – PPO | Admitting: Internal Medicine

## 2013-07-12 ENCOUNTER — Telehealth: Payer: Self-pay | Admitting: Family Medicine

## 2013-07-12 ENCOUNTER — Encounter: Payer: Self-pay | Admitting: Internal Medicine

## 2013-07-12 VITALS — BP 124/90 | HR 66 | Temp 98.5°F | Resp 20 | Wt 200.0 lb

## 2013-07-12 DIAGNOSIS — J019 Acute sinusitis, unspecified: Secondary | ICD-10-CM

## 2013-07-12 MED ORDER — HYDROCODONE-HOMATROPINE 5-1.5 MG/5ML PO SYRP: 5.0000 mL | ORAL_SOLUTION | Freq: Four times a day (QID) | ORAL | Status: AC | PRN

## 2013-07-12 NOTE — Telephone Encounter (Signed)
Patient Information:  Caller Name: [name]  Phone: [phone]  Patient: Clarence Moore, Clarence Moore  Gender: Male  DOB: October 13, 1965  Age: 48 Years  PCP: Elsie Stain Brigitte Pulse) [facility])  Office Follow Up:  Does the office need to follow up with this patient?: No  Instructions For The Office: N/A   Symptoms  Reason For Call & Symptoms: Patient reports he has sinus congeston and continued cough,  He was seen 06/30/13 for same and has taken Z-Pack with refill.  Sinus congestion and cough interfere with sleep.  He also states he feels worse in the evenings and possible wheezing.  Sinus pain rated at 4 of 10.  Emergent symptoms ruled out. See Today in Office per Sinus Pain and Congestion guideline due to Earache.  Reviewed Health History In EMR: Yes  Reviewed Medications In EMR: Yes  Reviewed Allergies In EMR: Yes  Reviewed Surgeries / Procedures: Yes  Date of Onset of Symptoms: 06/30/2013  Treatments Tried: Humidifier, Z-Pack, Sinus rinses  Treatments Tried Worked: No  Guideline(s) Used:  Sinus Pain and Congestion  Disposition Per Guideline:   See Today in Office  Reason For Disposition Reached:   Earache  Advice Given:  Reassurance:   Sinus congestion is a normal part of a cold.  Antibiotics are not helpful for the sinus congestion that occurs with colds.  Here is some care advice that should help.  For a Runny Nose With Profuse Discharge:  Nasal mucus and discharge helps to wash viruses and bacteria out of the nose and sinuses.  If the skin around your nostrils gets irritated, apply a tiny amount of petroleum ointment to the nasal openings once or twice a day.  For a Runny Nose With Profuse Discharge:  Nasal mucus and discharge helps to wash viruses and bacteria out of the nose and sinuses.  If the skin around your nostrils gets irritated, apply a tiny amount of petroleum ointment to the nasal openings once or twice a day.  Caution - Nasal Decongestants:  Do not take these  medications if you have high blood pressure, heart disease, prostate problems, or an overactive thyroid.  Pain and Fever Medicines:  For pain or fever relief, take either acetaminophen or ibuprofen.  They are over-the-counter (OTC) drugs that help treat both fever and pain. You can buy them at the drugstore.  Hydration:  Drink plenty of liquids (6-8 glasses of water daily). If the air in your home is dry, use a cool mist humidifier  Call Back If:   Sinus congestion (fullness) lasts longer than 10 days  You become worse.  Patient Will Follow Care Advice:  YES  Appointment Scheduled:  07/12/2013 15:45:00 Appointment Scheduled Provider:  Other  Dr. Inda Merlin at [place] location; caller requested to be seen in [facility]

## 2013-07-12 NOTE — Patient Instructions (Signed)
    Use saline irrigation, warm  moist compresses and over-the-counter decongestants only as directed.  Call if there is no improvement in 5 to 7 days, or sooner if you develop increasing pain, fever, or any new symptoms.  Take your antibiotic as prescribed until ALL of it is gone, but stop if you develop a rash, swelling, or any side effects of the medication.  Contact our office as soon as possible if  there are side effects of the medication. 

## 2013-07-12 NOTE — Progress Notes (Signed)
Subjective:    Patient ID: Clarence Moore, male    DOB: [birth year], 48 y.o.   MRN: [id number]  HPI  48 year old patient who is seen today in follow up. He was seen and treated on December 27 for a suspected acute sinusitis. He is completing his second round of azithromycin. If he is doing better but still having some persistent cough cough is his predominant symptom that interferes with sleep. His face with a dry nonproductive cough. No fever. Earlier he did have some bloody purulent transit has largely resolved. He is using a sinus rinse  Past Medical History  Diagnosis Date  . Left ureteral calculus 11/2012    21mm, s/p stent placement [name])  . ED (erectile dysfunction)     per uro    History   Social History  . Marital Status: Married    Spouse Name: N/A    Number of Children: N/A  . Years of Education: N/A   Occupational History  . Not on file.   Social History Main Topics  . Smoking status: Never Smoker   . Smokeless tobacco: Never Used  . Alcohol Use: No  . Drug Use: No  . Sexual Activity: Not on file   Other Topics Concern  . Not on file   Social History Narrative   Caffeine: 2 cups coffee/day   Lives with wife, daughter, outdoor pets   Occupation: distribution Tourist information centre manager   Edu: college   Activity: cuts wood, dirtbike, walks at work   Diet: good water, fruits/vegetables daily    Past Surgical History  Procedure Laterality Date  . Tonsillectomy and adenoidectomy  AGE 49  . Nasal turbinate reduction  2009  . Colonoscopy  2006    WNL ([place])  . Lithotripsy  11/2012    [facility]  . Cystoscopy with retrograde pyelogram, ureteroscopy and stent placement Left 11/20/2012    Procedure: CYSTOSCOPY WITH RETROGRADE PYELOGRAM, URETEROSCOPY AND STENT PLACEMENT;  Surgeon: Molli Hazard, MD;  Location[facility];  Service: Urology;  Laterality: Left;  CYSTO, (L) URETEROSCOPY, LASER LITHO, LEFT RETROGRADE PYELOGRAM, POSSIBLE LEFT  URETER STENT,    . Holmium laser application Left 7/82/9562    Procedure: HOLMIUM LASER APPLICATION;  Surgeon: Molli Hazard, MD;  Location: [facility];  Service: Urology;  Laterality: Left;    Family History  Problem Relation Age of Onset  . Cancer Paternal Grandmother 74    colon  . Colon polyps Paternal Grandfather   . Colon polyps Father   . Cancer Mother 49    lung, non small cell (smoker)  . Diabetes Neg Hx   . CAD Neg Hx   . Stroke Neg Hx   . Cancer Father     skin/esophageal  . Alzheimer's disease Paternal Grandfather   . Cancer Paternal Grandfather   . Other Father 71    prostate issues    Allergies  Allergen Reactions  . Penicillins Anaphylaxis    Current Outpatient Prescriptions on File Prior to Visit  Medication Sig Dispense Refill  . cetirizine (ZYRTEC) 10 MG tablet Take 10 mg by mouth daily as needed for allergies.       No current facility-administered medications on file prior to visit.    BP 124/90  Pulse 66  Temp(Src) 98.5 F (36.9 C) (Oral)  Resp 20  Wt 200 lb (90.719 kg)  SpO2 98%        Review of Systems  Constitutional: Positive for activity change and appetite  change. Negative for fever, chills and fatigue.  HENT: Positive for postnasal drip. Negative for congestion, dental problem, ear pain, hearing loss, sore throat, tinnitus, trouble swallowing and voice change.   Eyes: Negative for pain, discharge and visual disturbance.  Respiratory: Positive for cough. Negative for chest tightness, wheezing and stridor.   Cardiovascular: Negative for chest pain, palpitations and leg swelling.  Gastrointestinal: Negative for nausea, vomiting, abdominal pain, diarrhea, constipation, blood in stool and abdominal distention.  Genitourinary: Negative for urgency, hematuria, flank pain, discharge, difficulty urinating and genital sores.  Musculoskeletal: Negative for arthralgias, back pain, gait problem, joint swelling, myalgias  and neck stiffness.  Skin: Negative for rash.  Neurological: Negative for dizziness, syncope, speech difficulty, weakness, numbness and headaches.  Hematological: Negative for adenopathy. Does not bruise/bleed easily.  Psychiatric/Behavioral: Negative for behavioral problems and dysphoric mood. The patient is not nervous/anxious.        Objective:   Physical Exam  Constitutional: He is oriented to person, place, and time. He appears well-developed.  Frequent paroxysms of coughing  HENT:  Head: Normocephalic.  Right Ear: External ear normal.  Left Ear: External ear normal.  Eyes: Conjunctivae and EOM are normal.  Neck: Normal range of motion.  Cardiovascular: Normal rate and normal heart sounds.   Pulmonary/Chest: Breath sounds normal.  Abdominal: Bowel sounds are normal.  Musculoskeletal: Normal range of motion. He exhibits no edema and no tenderness.  Neurological: He is alert and oriented to person, place, and time.  Psychiatric: He has a normal mood and affect. His behavior is normal.          Assessment & Plan:   URI/recent sinusitis. Patient is completing antibiotic therapy. Will treat symptomatically with Hydromet. Will continue decongestants and saline sinus rinse

## 2013-07-12 NOTE — Telephone Encounter (Signed)
Seen today at the [place] office.

## 2013-07-12 NOTE — Progress Notes (Signed)
Pre-visit discussion using our clinic review tool. No additional management support is needed unless otherwise documented below in the visit note.  

## 2013-10-08 ENCOUNTER — Encounter: Payer: Self-pay | Admitting: Family Medicine

## 2013-10-08 ENCOUNTER — Ambulatory Visit (INDEPENDENT_AMBULATORY_CARE_PROVIDER_SITE_OTHER): Payer: BC Managed Care – PPO | Admitting: Family Medicine

## 2013-10-08 VITALS — BP 104/68 | HR 82 | Temp 98.0°F | Wt 204.5 lb

## 2013-10-08 DIAGNOSIS — J209 Acute bronchitis, unspecified: Secondary | ICD-10-CM

## 2013-10-08 MED ORDER — AZITHROMYCIN 250 MG PO TABS: ORAL_TABLET | ORAL | Status: DC

## 2013-10-08 MED ORDER — HYDROCOD POLST-CHLORPHEN POLST 10-8 MG/5ML PO LQCR: 5.0000 mL | Freq: Two times a day (BID) | ORAL | Status: DC | PRN

## 2013-10-08 NOTE — Assessment & Plan Note (Signed)
Recent sinusitis x2 earlier in the year, did completely resolve. Now endorses about 1 wk h/o allergic rhinitis that has progressed to productive cough with chest congestion and purulent phlegm. Anticipate bronchitis, but likely viral as early on. Supportive care as per instructions (ibuprofen, tussionex).  If not improving or any worsening discussed fill zpack WASP. Pt agrees with plan.

## 2013-10-08 NOTE — Patient Instructions (Signed)
Sounds like you have a bronchitis, likely viral at this point Viral infections usually take 7-10 days to resolve.  The cough can last a few weeks to go away. Start ibuprofen 400-600mg  twice daily with food. Use medication as prescribed: tussionex for sleep at night time zpack antibiotic to fill if not improving as expected or any worsening. Push fluids and plenty of rest. Call clinic with questions.  Good to see you today.

## 2013-10-08 NOTE — Progress Notes (Signed)
   BP 104/68  Pulse 82  Temp(Src) 98 F (36.7 C) (Oral)  Wt 204 lb 8 oz (92.761 kg)  SpO2 97%   CC: bronchitis?  Subjective:    Patient ID: Clarence Moore, male    DOB: 28-Nov-1965, 48 y.o.   MRN: [id number]  HPI: CORION SHERROD is a 48 y.o. male presenting on 10/08/2013 for Cough   3d h/o cough productive of green mucous, chest burning with cough, fever to 100 this morning.  + PNdrainage and headache and facial pain.  +ST.  Head < chest congestion.  No chills, short winded or wheezy,no ear or tooth pain.  Has tried tylenol and zyretec so far.  Also tried cough drops and numbing spray. + daughter sick recently. Initial allergy sxs, 1 wk ago , but sxs markedly deteriorated after he worked cleaning yard. No h/o asthma. No smokers at home.  Relevant past medical, surgical, family and social history reviewed and updated as indicated.  Allergies and medications reviewed and updated. Current Outpatient Prescriptions on File Prior to Visit  Medication Sig  . cetirizine (ZYRTEC) 10 MG tablet Take 10 mg by mouth daily as needed for allergies.   No current facility-administered medications on file prior to visit.    Review of Systems Per HPI unless specifically indicated above    Objective:    BP 104/68  Pulse 82  Temp(Src) 98 F (36.7 C) (Oral)  Wt 204 lb 8 oz (92.761 kg)  SpO2 97%  Physical Exam  Nursing note and vitals reviewed. Constitutional: He appears well-developed and well-nourished. No distress.  HENT:  Head: Normocephalic and atraumatic.  Right Ear: Hearing, tympanic membrane, external ear and ear canal normal.  Left Ear: Hearing, tympanic membrane, external ear and ear canal normal.  Nose: Mucosal edema present. No rhinorrhea. Right sinus exhibits maxillary sinus tenderness and frontal sinus tenderness. Left sinus exhibits maxillary sinus tenderness and frontal sinus tenderness.  Mouth/Throat: Uvula is midline and mucous membranes are normal. Posterior oropharyngeal  edema and posterior oropharyngeal erythema present. No oropharyngeal exudate or tonsillar abscesses.  Nasal mucosal congestion  Eyes: Conjunctivae and EOM are normal. Pupils are equal, round, and reactive to light. No scleral icterus.  Neck: Normal range of motion. Neck supple.  Cardiovascular: Normal rate, regular rhythm, normal heart sounds and intact distal pulses.   No murmur heard. Pulmonary/Chest: Effort normal and breath sounds normal. No respiratory distress. He has no wheezes. He has no rales.  clear  Lymphadenopathy:    He has no cervical adenopathy.  Skin: Skin is warm and dry. No rash noted.       Assessment & Plan:   Problem List Items Addressed This Visit   Acute bronchitis - Primary     Recent sinusitis x2 earlier in the year, did completely resolve. Now endorses about 1 wk h/o allergic rhinitis that has progressed to productive cough with chest congestion and purulent phlegm. Anticipate bronchitis, but likely viral as early on. Supportive care as per instructions (ibuprofen, tussionex).  If not improving or any worsening discussed fill zpack WASP. Pt agrees with plan.        Follow up plan: Return if symptoms worsen or fail to improve.

## 2013-10-08 NOTE — Progress Notes (Signed)
Pre visit review using our clinic review tool, if applicable. No additional management support is needed unless otherwise documented below in the visit note. 

## 2014-07-10 ENCOUNTER — Other Ambulatory Visit: Payer: Self-pay | Admitting: Family Medicine

## 2014-07-10 DIAGNOSIS — Z125 Encounter for screening for malignant neoplasm of prostate: Secondary | ICD-10-CM

## 2014-07-10 DIAGNOSIS — Z0000 Encounter for general adult medical examination without abnormal findings: Secondary | ICD-10-CM

## 2014-07-10 DIAGNOSIS — Z8042 Family history of malignant neoplasm of prostate: Secondary | ICD-10-CM

## 2014-07-12 ENCOUNTER — Other Ambulatory Visit (INDEPENDENT_AMBULATORY_CARE_PROVIDER_SITE_OTHER): Payer: BLUE CROSS/BLUE SHIELD

## 2014-07-12 DIAGNOSIS — Z8042 Family history of malignant neoplasm of prostate: Secondary | ICD-10-CM

## 2014-07-12 DIAGNOSIS — Z0000 Encounter for general adult medical examination without abnormal findings: Secondary | ICD-10-CM

## 2014-07-12 DIAGNOSIS — Z125 Encounter for screening for malignant neoplasm of prostate: Secondary | ICD-10-CM

## 2014-07-12 LAB — LIPID PANEL
Cholesterol: 171 mg/dL (ref 0–200)
HDL: 41.6 mg/dL (ref >39.00)
LDL Cholesterol: 116 mg/dL — ABNORMAL HIGH (ref 0–99)
NonHDL: 129.4
Total CHOL/HDL Ratio: 4
Triglycerides: 67 mg/dL (ref 0.0–149.0)
VLDL: 13.4 mg/dL (ref 0.0–40.0)

## 2014-07-12 LAB — BASIC METABOLIC PANEL
BUN: 19 mg/dL (ref 6–23)
CALCIUM: 9.5 mg/dL (ref 8.4–10.5)
CHLORIDE: 105 meq/L (ref 96–112)
CO2: 32 mEq/L (ref 19–32)
Creatinine, Ser: 0.9 mg/dL (ref 0.4–1.5)
GFR: 97.85 mL/min (ref >60.00)
GLUCOSE: 88 mg/dL (ref 70–99)
POTASSIUM: 5.1 meq/L (ref 3.5–5.1)
Sodium: 141 mEq/L (ref 135–145)

## 2014-07-12 LAB — PSA: PSA: 1.39 ng/mL (ref 0.10–4.00)

## 2014-07-19 ENCOUNTER — Encounter: Payer: Self-pay | Admitting: Family Medicine

## 2014-07-19 ENCOUNTER — Ambulatory Visit (INDEPENDENT_AMBULATORY_CARE_PROVIDER_SITE_OTHER): Payer: BLUE CROSS/BLUE SHIELD | Admitting: Family Medicine

## 2014-07-19 VITALS — BP 116/82 | HR 72 | Temp 98.0°F | Ht 73.0 in | Wt 202.0 lb

## 2014-07-19 DIAGNOSIS — Z800 Family history of malignant neoplasm of digestive organs: Secondary | ICD-10-CM

## 2014-07-19 DIAGNOSIS — Z23 Encounter for immunization: Secondary | ICD-10-CM

## 2014-07-19 DIAGNOSIS — Z0000 Encounter for general adult medical examination without abnormal findings: Secondary | ICD-10-CM

## 2014-07-19 NOTE — Addendum Note (Signed)
Addended by: Royann Shivers A on: 07/19/2014 04:07 PM   Modules accepted: Orders

## 2014-07-19 NOTE — Progress Notes (Signed)
Pre visit review using our clinic review tool, if applicable. No additional management support is needed unless otherwise documented below in the visit note. 

## 2014-07-19 NOTE — Patient Instructions (Addendum)
Tdap today (tetanus and whooping cough). Pass by [facility]'s office to schedule colonoscopy. Good to see you today, call us with questions.  Return as needed or in 1 year for next physical.

## 2014-07-19 NOTE — Assessment & Plan Note (Signed)
Preventative protocols reviewed and updated unless pt declined. Discussed healthy diet and lifestyle.  

## 2014-07-19 NOTE — Progress Notes (Signed)
BP 116/82 mmHg  Pulse 72  Temp(Src) 98 F (36.7 C) (Oral)  Ht 6\' 1"  (1.854 m)  Wt 202 lb (91.627 kg)  BMI 26.66 kg/m2   CC: CPE  Subjective:    Patient ID: Clarence Moore, male    DOB: 01/01/1966, 49 y.o.   MRN: [id number]  HPI: Clarence Moore is a 50 y.o. male presenting on 07/19/2014 for Annual Exam   Recent sinusitis - cxr ok, treated with zpack.  Preventative: Colonoscopy 2003 for fmhx. [place], WNL. Would like referral this year to establish near [place] or [place] area Prostate cancer - family history (father with prostate issues since age 51s).  Tdap today. Flu shot - declines Seat belt use discussed. Sunscreen use discussed. No changing moles on skin.  Caffeine: 2 cups coffee/day  Lives with wife, daughter, outdoor pets  Occupation: distribution Tourist information centre manager  Edu: college  Activity: cuts wood, dirtbike, walks at work  Diet: good water, fruits/vegetables daily   Relevant past medical, surgical, family and social history reviewed and updated as indicated. Interim medical history since our last visit reviewed. Allergies and medications reviewed and updated. Current Outpatient Prescriptions on File Prior to Visit  Medication Sig  . acetaminophen (TYLENOL) 500 MG tablet Take 500 mg by mouth every 6 (six) hours as needed.  . cetirizine (ZYRTEC) 10 MG tablet Take 10 mg by mouth daily as needed for allergies.   No current facility-administered medications on file prior to visit.    Review of Systems  Constitutional: Negative for fever, chills, activity change, appetite change, fatigue and unexpected weight change.  HENT: Negative for hearing loss.   Eyes: Negative for visual disturbance.  Respiratory: Negative for cough, chest tightness, shortness of breath and wheezing.   Cardiovascular: Negative for chest pain, palpitations and leg swelling.  Gastrointestinal: Negative for nausea, vomiting, abdominal pain, diarrhea, constipation, blood in  stool and abdominal distention.  Genitourinary: Negative for hematuria and difficulty urinating.  Musculoskeletal: Negative for myalgias, arthralgias and neck pain.  Skin: Negative for rash.  Neurological: Negative for dizziness, seizures, syncope and headaches.  Hematological: Negative for adenopathy. Does not bruise/bleed easily.  Psychiatric/Behavioral: Negative for dysphoric mood. The patient is not nervous/anxious.    Per HPI unless specifically indicated above     Objective:    BP 116/82 mmHg  Pulse 72  Temp(Src) 98 F (36.7 C) (Oral)  Ht 6\' 1"  (1.854 m)  Wt 202 lb (91.627 kg)  BMI 26.66 kg/m2  Wt Readings from Last 3 Encounters:  07/19/14 202 lb (91.627 kg)  10/08/13 204 lb 8 oz (92.761 kg)  07/12/13 200 lb (90.719 kg)    Physical Exam  Constitutional: He is oriented to person, place, and time. He appears well-developed and well-nourished. No distress.  HENT:  Head: Normocephalic and atraumatic.  Right Ear: Hearing, tympanic membrane, external ear and ear canal normal.  Left Ear: Hearing, tympanic membrane, external ear and ear canal normal.  Nose: Nose normal.  Mouth/Throat: Uvula is midline, oropharynx is clear and moist and mucous membranes are normal. No oropharyngeal exudate, posterior oropharyngeal edema or posterior oropharyngeal erythema.  Eyes: Conjunctivae and EOM are normal. Pupils are equal, round, and reactive to light. No scleral icterus.  Neck: Normal range of motion. Neck supple. Carotid bruit is not present. No thyromegaly present.  Cardiovascular: Normal rate, regular rhythm, normal heart sounds and intact distal pulses.   No murmur heard. Pulses:      Radial pulses are 2+ on the right side,  and 2+ on the left side.  Pulmonary/Chest: Effort normal and breath sounds normal. No respiratory distress. He has no wheezes. He has no rales.  Abdominal: Soft. Bowel sounds are normal. He exhibits no distension and no mass. There is no tenderness. There is no  rebound and no guarding.  Genitourinary: Rectum normal and prostate normal. Rectal exam shows no external hemorrhoid, no internal hemorrhoid, no fissure, no mass, no tenderness and anal tone normal. Prostate is not enlarged (15gm) and not tender.  Musculoskeletal: Normal range of motion. He exhibits no edema.  Lymphadenopathy:    He has no cervical adenopathy.  Neurological: He is alert and oriented to person, place, and time.  CN grossly intact, station and gait intact  Skin: Skin is warm and dry. No rash noted.  Multiple nevi throughout trunk  Psychiatric: He has a normal mood and affect. His behavior is normal. Judgment and thought content normal.  Nursing note and vitals reviewed.  Results for orders placed or performed in visit on 07/12/14  Lipid panel  Result Value Ref Range   Cholesterol 171 0 - 200 mg/dL   Triglycerides 67.0 0.0 - 149.0 mg/dL   HDL 41.60 >39.00 mg/dL   VLDL 13.4 0.0 - 40.0 mg/dL   LDL Cholesterol 116 (H) 0 - 99 mg/dL   Total CHOL/HDL Ratio 4    NonHDL 129.40   PSA  Result Value Ref Range   PSA 1.39 0.10 - 4.00 ng/mL  Basic metabolic panel  Result Value Ref Range   Sodium 141 135 - 145 mEq/L   Potassium 5.1 3.5 - 5.1 mEq/L   Chloride 105 96 - 112 mEq/L   CO2 32 19 - 32 mEq/L   Glucose, Bld 88 70 - 99 mg/dL   BUN 19 6 - 23 mg/dL   Creatinine, Ser 0.9 0.4 - 1.5 mg/dL   Calcium 9.5 8.4 - 10.5 mg/dL   GFR 97.85 >60.00 mL/min      Assessment & Plan:   Problem List Items Addressed This Visit    Healthcare maintenance - Primary    Preventative protocols reviewed and updated unless pt declined. Discussed healthy diet and lifestyle.      Family history of colon cancer   Relevant Orders   Ambulatory referral to Gastroenterology       Follow up plan: Return in about 1 year (around 07/20/2015), or as needed, for annual exam, prior fasting for blood work.

## 2014-07-21 ENCOUNTER — Encounter: Payer: Self-pay | Admitting: Family Medicine

## 2014-08-05 HISTORY — PX: COLONOSCOPY: SHX174

## 2014-09-02 LAB — HM COLONOSCOPY

## 2015-06-26 ENCOUNTER — Ambulatory Visit (INDEPENDENT_AMBULATORY_CARE_PROVIDER_SITE_OTHER): Payer: BLUE CROSS/BLUE SHIELD | Admitting: Family Medicine

## 2015-06-26 ENCOUNTER — Encounter: Payer: Self-pay | Admitting: Family Medicine

## 2015-06-26 VITALS — BP 116/80 | HR 80 | Temp 98.1°F | Wt 204.2 lb

## 2015-06-26 DIAGNOSIS — J069 Acute upper respiratory infection, unspecified: Secondary | ICD-10-CM | POA: Diagnosis not present

## 2015-06-26 MED ORDER — AZITHROMYCIN 250 MG PO TABS: ORAL_TABLET | ORAL | Status: DC

## 2015-06-26 MED ORDER — HYDROCOD POLST-CPM POLST ER 10-8 MG/5ML PO SUER: 5.0000 mL | Freq: Two times a day (BID) | ORAL | Status: DC | PRN

## 2015-06-26 NOTE — Progress Notes (Signed)
SUBJECTIVE:  Clarence Moore is a 49 y.o. male who complains of coryza, congestion,fever,  productive cough and fever for 10 days. He denies a history of anorexia and chest pain and denies a history of asthma. Patient denies smoke cigarettes.   Current Outpatient Prescriptions on File Prior to Visit  Medication Sig Dispense Refill  . acetaminophen (TYLENOL) 500 MG tablet Take 500 mg by mouth every 6 (six) hours as needed.    . cetirizine (ZYRTEC) 10 MG tablet Take 10 mg by mouth daily as needed for allergies.     No current facility-administered medications on file prior to visit.    Allergies  Allergen Reactions  . Penicillins Anaphylaxis    Past Medical History  Diagnosis Date  . Left ureteral calculus 11/2012    44mm, s/p stent placement Clarence Moore)  . ED (erectile dysfunction)     per uro    Past Surgical History  Procedure Laterality Date  . Tonsillectomy and adenoidectomy  AGE 84  . Nasal turbinate reduction  2009  . Colonoscopy  2003    WNL (Clarence Moore)  . Lithotripsy  11/2012    Clarence Moore  . Cystoscopy with retrograde pyelogram, ureteroscopy and stent placement Left 11/20/2012    Procedure: CYSTOSCOPY WITH RETROGRADE PYELOGRAM, URETEROSCOPY AND STENT PLACEMENT;  Surgeon: Clarence Hazard, MD;  Location: [facility];  Service: Urology;  Laterality: Left;  CYSTO, (L) URETEROSCOPY, LASER LITHO, LEFT RETROGRADE PYELOGRAM, POSSIBLE LEFT URETER STENT,    . Holmium laser application Left [id number]    Procedure: HOLMIUM LASER APPLICATION;  Surgeon: Clarence Hazard, MD;  Location: [facility];  Service: Urology;  Laterality: Left;    Family History  Problem Relation Age of Onset  . Cancer Paternal Grandmother 56    colon  . Colon polyps Paternal Grandfather   . Colon polyps Father   . Cancer Mother 59    lung, non small cell (smoker)  . Diabetes Neg Hx   . CAD Neg Hx   . Stroke Neg Hx   . Cancer Father     skin/esophageal  .  Alzheimer's disease Paternal Grandfather   . Cancer Paternal Grandfather   . Other Father 85    prostate issues    Social History   Social History  . Marital Status: Married    Spouse Name: N/A  . Number of Children: N/A  . Years of Education: N/A   Occupational History  . Not on file.   Social History Main Topics  . Smoking status: Never Smoker   . Smokeless tobacco: Never Used  . Alcohol Use: No  . Drug Use: No  . Sexual Activity: Not on file   Other Topics Concern  . Not on file   Social History Narrative   Caffeine: 2 cups coffee/day   Lives with wife, daughter, outdoor pets   Occupation: distribution Tourist information centre manager   Edu: college   Activity: cuts wood, dirtbike, walks at work   Diet: good water, fruits/vegetables daily   The PMH, PSH, Social History, Family History, Medications, and allergies have been reviewed in [facility], and have been updated if relevant.  OBJECTIVE: BP 116/80 mmHg  Pulse 80  Temp(Src) 98.1 F (36.7 C) (Oral)  Wt 204 lb 4 oz (92.647 kg)  SpO2 98%  He appears well, vital signs are as noted. Ears normal.  Throat and pharynx normal.  Neck supple. No adenopathy in the neck. Nose is congested. Sinuses  tender. The chest is clear, without wheezes  or rales.  ASSESSMENT:  sinusitis  PLAN: Given duration and progression of symptoms, will treat for bacterial sinusitis. Zpack- PCN allergic, tussionex as needed at night for severe cough. Symptomatic therapy suggested: push fluids, rest and return office visit prn if symptoms persist or worsen.  Call or return to clinic prn if these symptoms worsen or fail to improve as anticipated.

## 2015-06-26 NOTE — Progress Notes (Signed)
Pre visit review using our clinic review tool, if applicable. No additional management support is needed unless otherwise documented below in the visit note. 

## 2015-08-01 ENCOUNTER — Other Ambulatory Visit: Payer: BLUE CROSS/BLUE SHIELD

## 2015-08-01 ENCOUNTER — Other Ambulatory Visit: Payer: Self-pay | Admitting: Family Medicine

## 2015-08-01 DIAGNOSIS — Z8042 Family history of malignant neoplasm of prostate: Secondary | ICD-10-CM

## 2015-08-01 DIAGNOSIS — E785 Hyperlipidemia, unspecified: Secondary | ICD-10-CM

## 2015-08-04 ENCOUNTER — Other Ambulatory Visit (INDEPENDENT_AMBULATORY_CARE_PROVIDER_SITE_OTHER): Payer: BLUE CROSS/BLUE SHIELD

## 2015-08-04 DIAGNOSIS — Z8042 Family history of malignant neoplasm of prostate: Secondary | ICD-10-CM

## 2015-08-04 DIAGNOSIS — E785 Hyperlipidemia, unspecified: Secondary | ICD-10-CM

## 2015-08-04 LAB — LIPID PANEL
CHOL/HDL RATIO: 4
Cholesterol: 172 mg/dL (ref 0–200)
HDL: 47 mg/dL (ref >39.00)
LDL Cholesterol: 106 mg/dL — ABNORMAL HIGH (ref 0–99)
NONHDL: 125.38
TRIGLYCERIDES: 99 mg/dL (ref 0.0–149.0)
VLDL: 19.8 mg/dL (ref 0.0–40.0)

## 2015-08-04 LAB — BASIC METABOLIC PANEL
BUN: 18 mg/dL (ref 6–23)
CO2: 30 mEq/L (ref 19–32)
Calcium: 9.3 mg/dL (ref 8.4–10.5)
Chloride: 102 mEq/L (ref 96–112)
Creatinine, Ser: 0.91 mg/dL (ref 0.40–1.50)
GFR: 93.72 mL/min (ref >60.00)
Glucose, Bld: 93 mg/dL (ref 70–99)
POTASSIUM: 4.6 meq/L (ref 3.5–5.1)
SODIUM: 140 meq/L (ref 135–145)

## 2015-08-04 LAB — PSA: PSA: 1.55 ng/mL (ref 0.10–4.00)

## 2015-08-08 ENCOUNTER — Telehealth: Payer: Self-pay | Admitting: Family Medicine

## 2015-08-08 ENCOUNTER — Encounter: Payer: BLUE CROSS/BLUE SHIELD | Admitting: Family Medicine

## 2015-08-08 NOTE — Telephone Encounter (Signed)
plz call to reschedule CPE. Had labwork done already.

## 2015-08-08 NOTE — Telephone Encounter (Signed)
Pt did not come in for their appt today for cpe. Please let me know if pt needs to be contacted immediately for follow up or no follow up needed. Best phone number to contact pt is [phone].

## 2015-08-13 NOTE — Telephone Encounter (Signed)
Left a voicemail for patient to call back and reschedule appt.

## 2015-08-19 NOTE — Telephone Encounter (Signed)
Left message asking pt to call office  °

## 2015-08-20 NOTE — Telephone Encounter (Signed)
Appointment 2/20 pt aware

## 2015-08-25 ENCOUNTER — Ambulatory Visit (INDEPENDENT_AMBULATORY_CARE_PROVIDER_SITE_OTHER): Payer: BLUE CROSS/BLUE SHIELD | Admitting: Family Medicine

## 2015-08-25 ENCOUNTER — Encounter: Payer: Self-pay | Admitting: Family Medicine

## 2015-08-25 VITALS — BP 112/74 | HR 71 | Temp 98.2°F | Ht 71.0 in | Wt 204.0 lb

## 2015-08-25 DIAGNOSIS — E663 Overweight: Secondary | ICD-10-CM

## 2015-08-25 DIAGNOSIS — D229 Melanocytic nevi, unspecified: Secondary | ICD-10-CM

## 2015-08-25 DIAGNOSIS — Z0000 Encounter for general adult medical examination without abnormal findings: Secondary | ICD-10-CM

## 2015-08-25 DIAGNOSIS — E66811 Obesity, class 1: Secondary | ICD-10-CM | POA: Insufficient documentation

## 2015-08-25 NOTE — Patient Instructions (Addendum)
Sign release of information for records of colonoscopy 2016.  You are doing well today.  Good to see you today, call us with questions.  Health Maintenance, Male A healthy lifestyle and preventative care can promote health and wellness.  Maintain regular health, dental, and eye exams.  Eat a healthy diet. Foods like vegetables, fruits, whole grains, low-fat dairy products, and lean protein foods contain the nutrients you need and are low in calories. Decrease your intake of foods high in solid fats, added sugars, and salt. Get information about a proper diet from your health care provider, if necessary.  Regular physical exercise is one of the most important things you can do for your health. Most adults should get at least 150 minutes of moderate-intensity exercise (any activity that increases your heart rate and causes you to sweat) each week. In addition, most adults need muscle-strengthening exercises on 2 or more days a week.   Maintain a healthy weight. The body mass index (BMI) is a screening tool to identify possible weight problems. It provides an estimate of body fat based on height and weight. Your health care provider can find your BMI and can help you achieve or maintain a healthy weight. For males 20 years and older:  A BMI below 18.5 is considered underweight.  A BMI of 18.5 to 24.9 is normal.  A BMI of 25 to 29.9 is considered overweight.  A BMI of 30 and above is considered obese.  Maintain normal blood lipids and cholesterol by exercising and minimizing your intake of saturated fat. Eat a balanced diet with plenty of fruits and vegetables. Blood tests for lipids and cholesterol should begin at age 11 and be repeated every 5 years. If your lipid or cholesterol levels are high, you are over age 35, or you are at high risk for heart disease, you may need your cholesterol levels checked more frequently.Ongoing high lipid and cholesterol levels should be treated with medicines  if diet and exercise are not working.  If you smoke, find out from your health care provider how to quit. If you do not use tobacco, do not start.  Lung cancer screening is recommended for adults aged 74-80 years who are at high risk for developing lung cancer because of a history of smoking. A yearly low-dose CT scan of the lungs is recommended for people who have at least a 30-pack-year history of smoking and are current smokers or have quit within the past 15 years. A pack year of smoking is smoking an average of 1 pack of cigarettes a day for 1 year (for example, a 30-pack-year history of smoking could mean smoking 1 pack a day for 30 years or 2 packs a day for 15 years). Yearly screening should continue until the smoker has stopped smoking for at least 15 years. Yearly screening should be stopped for people who develop a health problem that would prevent them from having lung cancer treatment.  If you choose to drink alcohol, do not have more than 2 drinks per day. One drink is considered to be 12 oz (360 mL) of beer, 5 oz (150 mL) of wine, or 1.5 oz (45 mL) of liquor.  Avoid the use of street drugs. Do not share needles with anyone. Ask for help if you need support or instructions about stopping the use of drugs.  High blood pressure causes heart disease and increases the risk of stroke. High blood pressure is more likely to develop in:  People who have  blood pressure in the end of the normal range (100-139/85-89 mm Hg).  People who are overweight or obese.  People who are African American.  If you are 54-63 years of age, have your blood pressure checked every 3-5 years. If you are 39 years of age or older, have your blood pressure checked every year. You should have your blood pressure measured twice--once when you are at a hospital or clinic, and once when you are not at a hospital or clinic. Record the average of the two measurements. To check your blood pressure when you are not at a  hospital or clinic, you can use:  An automated blood pressure machine at a pharmacy.  A home blood pressure monitor.  If you are 79-46 years old, ask your health care provider if you should take aspirin to prevent heart disease.  Diabetes screening involves taking a blood sample to check your fasting blood sugar level. This should be done once every 3 years after age 10 if you are at a normal weight and without risk factors for diabetes. Testing should be considered at a younger age or be carried out more frequently if you are overweight and have at least 1 risk factor for diabetes.  Colorectal cancer can be detected and often prevented. Most routine colorectal cancer screening begins at the age of 64 and continues through age 57. However, your health care provider may recommend screening at an earlier age if you have risk factors for colon cancer. On a yearly basis, your health care provider may provide home test kits to check for hidden blood in the stool. A small camera at the end of a tube may be used to directly examine the colon (sigmoidoscopy or colonoscopy) to detect the earliest forms of colorectal cancer. Talk to your health care provider about this at age 81 when routine screening begins. A direct exam of the colon should be repeated every 5-10 years through age 91, unless early forms of precancerous polyps or small growths are found.  People who are at an increased risk for hepatitis B should be screened for this virus. You are considered at high risk for hepatitis B if:  You were born in a country where hepatitis B occurs often. Talk with your health care provider about which countries are considered high risk.  Your parents were born in a high-risk country and you have not received a shot to protect against hepatitis B (hepatitis B vaccine).  You have HIV or AIDS.  You use needles to inject street drugs.  You live with, or have sex with, someone who has hepatitis B.  You are a  man who has sex with other men (MSM).  You get hemodialysis treatment.  You take certain medicines for conditions like cancer, organ transplantation, and autoimmune conditions.  Hepatitis C blood testing is recommended for all people born from [birth year] through 1965 and any individual with known risk factors for hepatitis C.  Healthy men should no longer receive prostate-specific antigen (PSA) blood tests as part of routine cancer screening. Talk to your health care provider about prostate cancer screening.  Testicular cancer screening is not recommended for adolescents or adult males who have no symptoms. Screening includes self-exam, a health care provider exam, and other screening tests. Consult with your health care provider about any symptoms you have or any concerns you have about testicular cancer.  Practice safe sex. Use condoms and avoid high-risk sexual practices to reduce the spread of sexually transmitted infections (  STIs).  You should be screened for STIs, including gonorrhea and chlamydia if:  You are sexually active and are younger than 24 years.  You are older than 24 years, and your health care provider tells you that you are at risk for this type of infection.  Your sexual activity has changed since you were last screened, and you are at an increased risk for chlamydia or gonorrhea. Ask your health care provider if you are at risk.  If you are at risk of being infected with HIV, it is recommended that you take a prescription medicine daily to prevent HIV infection. This is called pre-exposure prophylaxis (PrEP). You are considered at risk if:  You are a man who has sex with other men (MSM).  You are a heterosexual man who is sexually active with multiple partners.  You take drugs by injection.  You are sexually active with a partner who has HIV.  Talk with your health care provider about whether you are at high risk of being infected with HIV. If you choose to begin PrEP,  you should first be tested for HIV. You should then be tested every 3 months for as long as you are taking PrEP.  Use sunscreen. Apply sunscreen liberally and repeatedly throughout the day. You should seek shade when your shadow is shorter than you. Protect yourself by wearing long sleeves, pants, a wide-brimmed hat, and sunglasses year round whenever you are outdoors.  Tell your health care provider of new moles or changes in moles, especially if there is a change in shape or color. Also, tell your health care provider if a mole is larger than the size of a pencil eraser.  A one-time screening for abdominal aortic aneurysm (AAA) and surgical repair of large AAAs by ultrasound is recommended for men aged 75-75 years who are current or former smokers.  Stay current with your vaccines (immunizations).   This information is not intended to replace advice given to you by your health care provider. Make sure you discuss any questions you have with your health care provider.   Document Released: 12/18/2007 Document Revised: 07/12/2014 Document Reviewed: 11/16/2010 Elsevier Interactive Patient Education [employer].

## 2015-08-25 NOTE — Assessment & Plan Note (Addendum)
Discussed healthy diet and ideal weight.

## 2015-08-25 NOTE — Assessment & Plan Note (Signed)
Encouraged f/u with derm.

## 2015-08-25 NOTE — Progress Notes (Signed)
BP 112/74 mmHg  Pulse 71  Temp(Src) 98.2 F (36.8 C) (Oral)  Ht 5\' 11"  (1.803 m)  Wt 204 lb (92.534 kg)  BMI 28.46 kg/m2  SpO2 97%   CC: CPE  Subjective:    Patient ID: Clarence Moore, male    DOB: 10-26-1965, 50 y.o.   MRN: [id number]  HPI: DODGER SHY is a 50 y.o. male presenting on 08/25/2015 for Annual Exam   Preventative: Colonoscopy 2016 [facility] - records requested today Prostate cancer - family history (father with prostate issues since age 1s).  Flu shot - declines Tdap 2016 Seat belt use discussed.  Sunscreen use discussed. No changing moles on skin. Due to f/u with derm.   Caffeine: 2 cups coffee/day  Lives with wife, daughter, outdoor pets  Occupation: distribution Tourist information centre manager  Edu: college  Activity: cuts wood, dirtbike, walks at work  Diet: good water, fruits/vegetables daily   Relevant past medical, surgical, family and social history reviewed and updated as indicated. Interim medical history since our last visit reviewed. Allergies and medications reviewed and updated. Current Outpatient Prescriptions on File Prior to Visit  Medication Sig  . cetirizine (ZYRTEC) 10 MG tablet Take 10 mg by mouth daily as needed for allergies.   No current facility-administered medications on file prior to visit.    Review of Systems  Constitutional: Negative for fever, chills, activity change, appetite change, fatigue and unexpected weight change.  HENT: Negative for hearing loss.   Eyes: Negative for visual disturbance.  Respiratory: Negative for cough, chest tightness, shortness of breath and wheezing.   Cardiovascular: Negative for chest pain, palpitations and leg swelling.  Gastrointestinal: Negative for nausea, vomiting, abdominal pain, diarrhea, constipation, blood in stool and abdominal distention.  Genitourinary: Negative for hematuria and difficulty urinating.  Musculoskeletal: Negative for myalgias, arthralgias and  neck pain.  Skin: Negative for rash.  Neurological: Negative for dizziness, seizures, syncope and headaches.  Hematological: Negative for adenopathy. Does not bruise/bleed easily.  Psychiatric/Behavioral: Negative for dysphoric mood. The patient is not nervous/anxious.    Per HPI unless specifically indicated in ROS section     Objective:    BP 112/74 mmHg  Pulse 71  Temp(Src) 98.2 F (36.8 C) (Oral)  Ht 5\' 11"  (1.803 m)  Wt 204 lb (92.534 kg)  BMI 28.46 kg/m2  SpO2 97%  Wt Readings from Last 3 Encounters:  08/25/15 204 lb (92.534 kg)  06/26/15 204 lb 4 oz (92.647 kg)  07/19/14 202 lb (91.627 kg)    Physical Exam  Constitutional: He is oriented to person, place, and time. He appears well-developed and well-nourished. No distress.  HENT:  Head: Normocephalic and atraumatic.  Right Ear: Hearing, tympanic membrane, external ear and ear canal normal.  Left Ear: Hearing, tympanic membrane, external ear and ear canal normal.  Nose: Nose normal.  Mouth/Throat: Uvula is midline, oropharynx is clear and moist and mucous membranes are normal. No oropharyngeal exudate, posterior oropharyngeal edema or posterior oropharyngeal erythema.  Eyes: Conjunctivae and EOM are normal. Pupils are equal, round, and reactive to light. No scleral icterus.  Neck: Normal range of motion. Neck supple. No thyromegaly present.  Cardiovascular: Normal rate, regular rhythm, normal heart sounds and intact distal pulses.   No murmur heard. Pulses:      Radial pulses are 2+ on the right side, and 2+ on the left side.  Pulmonary/Chest: Effort normal and breath sounds normal. No respiratory distress. He has no wheezes. He has no rales.  Abdominal: Soft. Bowel sounds are normal. He exhibits no distension and no mass. There is no tenderness. There is no rebound and no guarding.  Genitourinary: Rectum normal and prostate normal. Rectal exam shows no external hemorrhoid, no internal hemorrhoid, no fissure, no mass, no  tenderness and anal tone normal. Prostate is not enlarged and not tender.  Musculoskeletal: Normal range of motion. He exhibits no edema.  Lymphadenopathy:    He has no cervical adenopathy.  Neurological: He is alert and oriented to person, place, and time.  CN grossly intact, station and gait intact  Skin: Skin is warm and dry. No rash noted.  Multiple large moles throughout body  Psychiatric: He has a normal mood and affect. His behavior is normal. Judgment and thought content normal.  Nursing note and vitals reviewed.  Results for orders placed or performed in visit on 08/04/15  Lipid panel  Result Value Ref Range   Cholesterol 172 0 - 200 mg/dL   Triglycerides 99.0 0.0 - 149.0 mg/dL   HDL 47.00 >39.00 mg/dL   VLDL 19.8 0.0 - 40.0 mg/dL   LDL Cholesterol 106 (H) 0 - 99 mg/dL   Total CHOL/HDL Ratio 4    NonHDL 125.38   PSA  Result Value Ref Range   PSA 1.55 0.10 - 4.00 ng/mL  Basic metabolic panel  Result Value Ref Range   Sodium 140 135 - 145 mEq/L   Potassium 4.6 3.5 - 5.1 mEq/L   Chloride 102 96 - 112 mEq/L   CO2 30 19 - 32 mEq/L   Glucose, Bld 93 70 - 99 mg/dL   BUN 18 6 - 23 mg/dL   Creatinine, Ser 0.91 0.40 - 1.50 mg/dL   Calcium 9.3 8.4 - 10.5 mg/dL   GFR 93.72 >60.00 mL/min      Assessment & Plan:   Problem List Items Addressed This Visit    Overweight    Discussed healthy diet and ideal weight.      Multiple nevi    Encouraged f/u with derm.      Healthcare maintenance - Primary    Preventative protocols reviewed and updated unless pt declined. Discussed healthy diet and lifestyle.           Follow up plan: Return in about 1 year (around 08/24/2016), or as needed, for annual exam, prior fasting for blood work.

## 2015-08-25 NOTE — Progress Notes (Signed)
Pre visit review using our clinic review tool, if applicable. No additional management support is needed unless otherwise documented below in the visit note. 

## 2015-08-25 NOTE — Assessment & Plan Note (Signed)
Preventative protocols reviewed and updated unless pt declined. Discussed healthy diet and lifestyle.  

## 2015-09-13 ENCOUNTER — Encounter: Payer: Self-pay | Admitting: Family Medicine

## 2015-10-29 DIAGNOSIS — R0981 Nasal congestion: Secondary | ICD-10-CM | POA: Diagnosis not present

## 2015-10-29 DIAGNOSIS — J0100 Acute maxillary sinusitis, unspecified: Secondary | ICD-10-CM | POA: Diagnosis not present

## 2015-10-29 DIAGNOSIS — R05 Cough: Secondary | ICD-10-CM | POA: Diagnosis not present

## 2015-10-29 DIAGNOSIS — J3489 Other specified disorders of nose and nasal sinuses: Secondary | ICD-10-CM | POA: Diagnosis not present

## 2016-04-21 DIAGNOSIS — J0100 Acute maxillary sinusitis, unspecified: Secondary | ICD-10-CM | POA: Diagnosis not present

## 2016-04-21 DIAGNOSIS — J208 Acute bronchitis due to other specified organisms: Secondary | ICD-10-CM | POA: Diagnosis not present

## 2016-06-01 ENCOUNTER — Ambulatory Visit (INDEPENDENT_AMBULATORY_CARE_PROVIDER_SITE_OTHER): Payer: BLUE CROSS/BLUE SHIELD | Admitting: Internal Medicine

## 2016-06-01 ENCOUNTER — Encounter: Payer: Self-pay | Admitting: Internal Medicine

## 2016-06-01 VITALS — BP 116/78 | HR 68 | Temp 98.3°F | Wt 207.5 lb

## 2016-06-01 DIAGNOSIS — R590 Localized enlarged lymph nodes: Secondary | ICD-10-CM | POA: Diagnosis not present

## 2016-06-01 DIAGNOSIS — R0989 Other specified symptoms and signs involving the circulatory and respiratory systems: Secondary | ICD-10-CM | POA: Diagnosis not present

## 2016-06-01 NOTE — Progress Notes (Signed)
Subjective:    Patient ID: Clarence Moore, male    DOB: Aug 12, 1965, 50 y.o.   MRN: [id number]  HPI  Pt presents to the clinic today with c/o a lump on the right side of his neck. He noticed this about 2 months ago. He has noticed it intermittently. He reports it is bigger first thing in the morning, and usually decreases in size throughout the day. He denies headache, ear pain, nasal congestion or cough. He reports he intermittently has a runny nose and scratchy throat, but nothing consistent. During those times, he is blowing clear mucous out of his nose. He takes Zyrtec as needed. This lump on the side of his neck is tender to touch when it gets big. He denies fatigue, bruising, or weight loss. He reports he was treated on 10/18 with Clarithromycin for a sinus infection. He did have the lump at that time and thought it may have been related to that, but it persisted despite resolution of his other symptoms. He has no history of seasonal allergies. He has not had sick contacts.  Review of Systems      Past Medical History:  Diagnosis Date  . ED (erectile dysfunction)    per uro  . Left ureteral calculus 11/2012   35mm, s/p stent placement [name])    Current Outpatient Prescriptions  Medication Sig Dispense Refill  . cetirizine (ZYRTEC) 10 MG tablet Take 10 mg by mouth daily.     No current facility-administered medications for this visit.     Allergies  Allergen Reactions  . Penicillins Anaphylaxis    Family History  Problem Relation Age of Onset  . Cancer Paternal Grandmother 24    colon  . Colon polyps Paternal Grandfather   . Alzheimer's disease Paternal Grandfather   . Cancer Paternal Grandfather     prostate  . Colon polyps Father   . Cancer Father     skin/esophageal  . Other Father 52    prostate issues  . Cancer Mother 61    lung, non small cell (smoker)  . Diabetes Neg Hx   . CAD Neg Hx   . Stroke Neg Hx     Social History   Social History  . Marital  status: Married    Spouse name: N/A  . Number of children: N/A  . Years of education: N/A   Occupational History  . Not on file.   Social History Main Topics  . Smoking status: Never Smoker  . Smokeless tobacco: Never Used  . Alcohol use No  . Drug use: No  . Sexual activity: Not on file   Other Topics Concern  . Not on file   Social History Narrative   Caffeine: 2 cups coffee/day   Lives with wife, daughter, outdoor pets   Occupation: distribution Tourist information centre manager   Edu: college   Activity: cuts wood, dirtbike, walks at work   Diet: good water, fruits/vegetables daily     Constitutional: Denies fever, malaise, fatigue, headache or abrupt weight changes.  HEENT: Pt reports runny nose. Denies eye pain, eye redness, ear pain, ringing in the ears, wax buildup, nasal congestion, bloody nose, or sore throat. Respiratory: Denies difficulty breathing, shortness of breath, cough or sputum production.   Cardiovascular: Denies chest pain, chest tightness, palpitations or swelling in the hands or feet.  Skin: Pt reports lump of neck. Denies redness, rashes, lesions or ulcercations.   No other specific complaints in a complete review of systems (except as listed  in HPI above).  Objective:   Physical Exam   BP 116/78   Pulse 68   Temp 98.3 F (36.8 C) (Oral)   Wt 207 lb 8 oz (94.1 kg)   SpO2 98%   BMI 28.94 kg/m  Wt Readings from Last 3 Encounters:  06/01/16 207 lb 8 oz (94.1 kg)  08/25/15 204 lb (92.5 kg)  06/26/15 204 lb 4 oz (92.6 kg)    General: Appears his stated age, well developed, well nourished in NAD. Skin: Warm, dry and intact. No rashes, lesions or ulcerations noted. HEENT: Head: normal shape and size, no sinus tenderness noted; Eyes: sclera white, no icterus, conjunctiva pink,; Ears: Tm's gray and intact, normal light reflex; Nose: mucosa pink and moist, septum midline; Throat/Mouth: Teeth present, mucosa pink and moist, + PND, no exudate, lesions or  ulcerations noted.  Neck:  No adenopathy noted. (He reports he can not feel it at this time either) Cardiovascular: Normal rate and rhythm. S1,S2 noted.  No murmur, rubs or gallops noted. Pulmonary/Chest: Normal effort and positive vesicular breath sounds. No respiratory distress. No wheezes, rales or ronchi noted.   BMET    Component Value Date/Time   NA 140 08/04/2015 0837   K 4.6 08/04/2015 0837   CL 102 08/04/2015 0837   CO2 30 08/04/2015 0837   GLUCOSE 93 08/04/2015 0837   BUN 18 08/04/2015 0837   CREATININE 0.91 08/04/2015 0837   CALCIUM 9.3 08/04/2015 0837    Lipid Panel     Component Value Date/Time   CHOL 172 08/04/2015 0837   TRIG 99.0 08/04/2015 0837   HDL 47.00 08/04/2015 0837   CHOLHDL 4 08/04/2015 0837   VLDL 19.8 08/04/2015 0837   LDLCALC 106 (H) 08/04/2015 0837    CBC    Component Value Date/Time   WBC 13.9 (H) 11/10/2012 1207   RBC 4.54 11/10/2012 1207   HGB 13.9 11/10/2012 1207   HCT 40.8 11/10/2012 1207   PLT 303.0 11/10/2012 1207   MCV 90.1 11/10/2012 1207   MCHC 34.1 11/10/2012 1207   RDW 12.9 11/10/2012 1207   LYMPHSABS 1.4 11/10/2012 1207   MONOABS 0.5 11/10/2012 1207   EOSABS 0.1 11/10/2012 1207   BASOSABS 0.0 11/10/2012 1207    Hgb A1C No results found for: HGBA1C         Assessment & Plan:   Runny nose, cervical adenopathy:  ? If this is related to allergies Advised him to start taking Zyrtec on a consistent basis to see if this help No red flags noticed today but will get CBC with diff If persists or enlarges, we can always order ultrasound to evaluate the lymphadenopathy but I do not feel like that is indicated at this time  Return precautions discussed Webb Silversmith, NP

## 2016-06-01 NOTE — Patient Instructions (Signed)
Lymphadenopathy Introduction Lymphadenopathy refers to swollen or enlarged lymph glands, also called lymph nodes. Lymph glands are part of your body's defense (immune) system, which protects the body from infections, germs, and diseases. Lymph glands are found in many locations in your body, including the neck, underarm, and groin. Many things can cause lymph glands to become enlarged. When your immune system responds to germs, such as viruses or bacteria, infection-fighting cells and fluid build up. This causes the glands to grow in size. Usually, this is not something to worry about. The swelling and any soreness often go away without treatment. However, swollen lymph glands can also be caused by a number of diseases. Your health care provider may do various tests to help determine the cause. If the cause of your swollen lymph glands cannot be found, it is important to monitor your condition to make sure the swelling goes away. Follow these instructions at home: Watch your condition for any changes. The following actions may help to lessen any discomfort you are feeling:  Get plenty of rest.  Take medicines only as directed by your health care provider. Your health care provider may recommend over-the-counter medicines for pain.  Apply moist heat compresses to the site of swollen lymph nodes as directed by your health care provider. This can help reduce any pain.  Check your lymph nodes daily for any changes.  Keep all follow-up visits as directed by your health care provider. This is important. Contact a health care provider if:  Your lymph nodes are still swollen after 2 weeks.  Your swelling increases or spreads to other areas.  Your lymph nodes are hard, seem fixed to the skin, or are growing rapidly.  Your skin over the lymph nodes is red and inflamed.  You have a fever.  You have chills.  You have fatigue.  You develop a sore throat.  You have abdominal pain.  You have  weight loss.  You have night sweats. Get help right away if:  You notice fluid leaking from the area of the enlarged lymph node.  You have severe pain in any area of your body.  You have chest pain.  You have shortness of breath. This information is not intended to replace advice given to you by your health care provider. Make sure you discuss any questions you have with your health care provider. Document Released: 03/30/2008 Document Revised: 11/27/2015 Document Reviewed: 01/24/2014  2017 Elsevier  

## 2016-06-02 LAB — CBC WITH DIFFERENTIAL/PLATELET
BASOS ABS: 0.1 10*3/uL (ref 0.0–0.1)
Basophils Relative: 1 % (ref 0.0–3.0)
EOS ABS: 0.2 10*3/uL (ref 0.0–0.7)
Eosinophils Relative: 1.8 % (ref 0.0–5.0)
HCT: 40.5 % (ref 39.0–52.0)
Hemoglobin: 13.8 g/dL (ref 13.0–17.0)
LYMPHS ABS: 1.7 10*3/uL (ref 0.7–4.0)
Lymphocytes Relative: 16.2 % (ref 12.0–46.0)
MCHC: 34 g/dL (ref 30.0–36.0)
MCV: 91 fl (ref 78.0–100.0)
MONO ABS: 0.6 10*3/uL (ref 0.1–1.0)
Monocytes Relative: 5.6 % (ref 3.0–12.0)
NEUTROS PCT: 75.4 % (ref 43.0–77.0)
Neutro Abs: 8.1 10*3/uL — ABNORMAL HIGH (ref 1.4–7.7)
Platelets: 281 10*3/uL (ref 150.0–400.0)
RBC: 4.45 Mil/uL (ref 4.22–5.81)
RDW: 13.1 % (ref 11.5–15.5)
WBC: 10.7 10*3/uL — ABNORMAL HIGH (ref 4.0–10.5)

## 2016-08-22 ENCOUNTER — Other Ambulatory Visit: Payer: Self-pay | Admitting: Family Medicine

## 2016-08-22 DIAGNOSIS — E663 Overweight: Secondary | ICD-10-CM

## 2016-08-22 DIAGNOSIS — Z8042 Family history of malignant neoplasm of prostate: Secondary | ICD-10-CM

## 2016-08-23 ENCOUNTER — Other Ambulatory Visit (INDEPENDENT_AMBULATORY_CARE_PROVIDER_SITE_OTHER): Payer: BLUE CROSS/BLUE SHIELD

## 2016-08-23 DIAGNOSIS — Z8042 Family history of malignant neoplasm of prostate: Secondary | ICD-10-CM | POA: Diagnosis not present

## 2016-08-23 DIAGNOSIS — E663 Overweight: Secondary | ICD-10-CM

## 2016-08-23 LAB — LIPID PANEL
CHOLESTEROL: 153 mg/dL (ref 0–200)
HDL: 40.5 mg/dL (ref >39.00)
LDL Cholesterol: 90 mg/dL (ref 0–99)
NonHDL: 112.47
Total CHOL/HDL Ratio: 4
Triglycerides: 114 mg/dL (ref 0.0–149.0)
VLDL: 22.8 mg/dL (ref 0.0–40.0)

## 2016-08-23 LAB — COMPREHENSIVE METABOLIC PANEL
ALBUMIN: 4.3 g/dL (ref 3.5–5.2)
ALK PHOS: 81 U/L (ref 39–117)
ALT: 26 U/L (ref 0–53)
AST: 19 U/L (ref 0–37)
BUN: 21 mg/dL (ref 6–23)
CO2: 30 mEq/L (ref 19–32)
CREATININE: 0.91 mg/dL (ref 0.40–1.50)
Calcium: 9.5 mg/dL (ref 8.4–10.5)
Chloride: 104 mEq/L (ref 96–112)
GFR: 93.33 mL/min (ref >60.00)
Glucose, Bld: 88 mg/dL (ref 70–99)
Potassium: 4.4 mEq/L (ref 3.5–5.1)
SODIUM: 140 meq/L (ref 135–145)
TOTAL PROTEIN: 6.7 g/dL (ref 6.0–8.3)
Total Bilirubin: 0.5 mg/dL (ref 0.2–1.2)

## 2016-08-23 LAB — PSA: PSA: 1.28 ng/mL (ref 0.10–4.00)

## 2016-08-27 ENCOUNTER — Ambulatory Visit (INDEPENDENT_AMBULATORY_CARE_PROVIDER_SITE_OTHER): Payer: BLUE CROSS/BLUE SHIELD | Admitting: Family Medicine

## 2016-08-27 ENCOUNTER — Encounter: Payer: Self-pay | Admitting: Family Medicine

## 2016-08-27 VITALS — BP 116/72 | HR 72 | Temp 98.3°F | Ht 71.0 in | Wt 207.8 lb

## 2016-08-27 DIAGNOSIS — Z0000 Encounter for general adult medical examination without abnormal findings: Secondary | ICD-10-CM

## 2016-08-27 DIAGNOSIS — R590 Localized enlarged lymph nodes: Secondary | ICD-10-CM | POA: Diagnosis not present

## 2016-08-27 NOTE — Assessment & Plan Note (Signed)
Thought dental related. Seeing oral surgery.

## 2016-08-27 NOTE — Assessment & Plan Note (Signed)
Preventative protocols reviewed and updated unless pt declined. Discussed healthy diet and lifestyle.  

## 2016-08-27 NOTE — Progress Notes (Signed)
Pre visit review using our clinic review tool, if applicable. No additional management support is needed unless otherwise documented below in the visit note. 

## 2016-08-27 NOTE — Patient Instructions (Addendum)
You are doing well today Return as needed or in 1 year for next physical.  Health Maintenance, Male A healthy lifestyle and preventative care can promote health and wellness.  Maintain regular health, dental, and eye exams.  Eat a healthy diet. Foods like vegetables, fruits, whole grains, low-fat dairy products, and lean protein foods contain the nutrients you need and are low in calories. Decrease your intake of foods high in solid fats, added sugars, and salt. Get information about a proper diet from your health care provider, if necessary.  Regular physical exercise is one of the most important things you can do for your health. Most adults should get at least 150 minutes of moderate-intensity exercise (any activity that increases your heart rate and causes you to sweat) each week. In addition, most adults need muscle-strengthening exercises on 2 or more days a week.   Maintain a healthy weight. The body mass index (BMI) is a screening tool to identify possible weight problems. It provides an estimate of body fat based on height and weight. Your health care provider can find your BMI and can help you achieve or maintain a healthy weight. For males 20 years and older:  A BMI below 18.5 is considered underweight.  A BMI of 18.5 to 24.9 is normal.  A BMI of 25 to 29.9 is considered overweight.  A BMI of 30 and above is considered obese.  Maintain normal blood lipids and cholesterol by exercising and minimizing your intake of saturated fat. Eat a balanced diet with plenty of fruits and vegetables. Blood tests for lipids and cholesterol should begin at age 20 and be repeated every 5 years. If your lipid or cholesterol levels are high, you are over age 50, or you are at high risk for heart disease, you may need your cholesterol levels checked more frequently.Ongoing high lipid and cholesterol levels should be treated with medicines if diet and exercise are not working.  If you smoke, find out  from your health care provider how to quit. If you do not use tobacco, do not start.  Lung cancer screening is recommended for adults aged 55-80 years who are at high risk for developing lung cancer because of a history of smoking. A yearly low-dose CT scan of the lungs is recommended for people who have at least a 30-pack-year history of smoking and are current smokers or have quit within the past 15 years. A pack year of smoking is smoking an average of 1 pack of cigarettes a day for 1 year (for example, a 30-pack-year history of smoking could mean smoking 1 pack a day for 30 years or 2 packs a day for 15 years). Yearly screening should continue until the smoker has stopped smoking for at least 15 years. Yearly screening should be stopped for people who develop a health problem that would prevent them from having lung cancer treatment.  If you choose to drink alcohol, do not have more than 2 drinks per day. One drink is considered to be 12 oz (360 mL) of beer, 5 oz (150 mL) of wine, or 1.5 oz (45 mL) of liquor.  Avoid the use of street drugs. Do not share needles with anyone. Ask for help if you need support or instructions about stopping the use of drugs.  High blood pressure causes heart disease and increases the risk of stroke. High blood pressure is more likely to develop in:  People who have blood pressure in the end of the normal range (100-139/85-89   mm Hg).  People who are overweight or obese.  People who are African American.  If you are 18-39 years of age, have your blood pressure checked every 3-5 years. If you are 40 years of age or older, have your blood pressure checked every year. You should have your blood pressure measured twice-once when you are at a hospital or clinic, and once when you are not at a hospital or clinic. Record the average of the two measurements. To check your blood pressure when you are not at a hospital or clinic, you can use:  An automated blood pressure  machine at a pharmacy.  A home blood pressure monitor.  If you are 45-79 years old, ask your health care provider if you should take aspirin to prevent heart disease.  Diabetes screening involves taking a blood sample to check your fasting blood sugar level. This should be done once every 3 years after age 45 if you are at a normal weight and without risk factors for diabetes. Testing should be considered at a younger age or be carried out more frequently if you are overweight and have at least 1 risk factor for diabetes.  Colorectal cancer can be detected and often prevented. Most routine colorectal cancer screening begins at the age of 50 and continues through age 75. However, your health care provider may recommend screening at an earlier age if you have risk factors for colon cancer. On a yearly basis, your health care provider may provide home test kits to check for hidden blood in the stool. A small camera at the end of a tube may be used to directly examine the colon (sigmoidoscopy or colonoscopy) to detect the earliest forms of colorectal cancer. Talk to your health care provider about this at age 50 when routine screening begins. A direct exam of the colon should be repeated every 5-10 years through age 75, unless early forms of precancerous polyps or small growths are found.  People who are at an increased risk for hepatitis B should be screened for this virus. You are considered at high risk for hepatitis B if:  You were born in a country where hepatitis B occurs often. Talk with your health care provider about which countries are considered high risk.  Your parents were born in a high-risk country and you have not received a shot to protect against hepatitis B (hepatitis B vaccine).  You have HIV or AIDS.  You use needles to inject street drugs.  You live with, or have sex with, someone who has hepatitis B.  You are a man who has sex with other men (MSM).  You get hemodialysis  treatment.  You take certain medicines for conditions like cancer, organ transplantation, and autoimmune conditions.  Hepatitis C blood testing is recommended for all people born from 1945 through 1965 and any individual with known risk factors for hepatitis C.  Healthy men should no longer receive prostate-specific antigen (PSA) blood tests as part of routine cancer screening. Talk to your health care provider about prostate cancer screening.  Testicular cancer screening is not recommended for adolescents or adult males who have no symptoms. Screening includes self-exam, a health care provider exam, and other screening tests. Consult with your health care provider about any symptoms you have or any concerns you have about testicular cancer.  Practice safe sex. Use condoms and avoid high-risk sexual practices to reduce the spread of sexually transmitted infections (STIs).  You should be screened for STIs, including gonorrhea   and chlamydia if:  You are sexually active and are younger than 24 years.  You are older than 24 years, and your health care provider tells you that you are at risk for this type of infection.  Your sexual activity has changed since you were last screened, and you are at an increased risk for chlamydia or gonorrhea. Ask your health care provider if you are at risk.  If you are at risk of being infected with HIV, it is recommended that you take a prescription medicine daily to prevent HIV infection. This is called pre-exposure prophylaxis (PrEP). You are considered at risk if:  You are a man who has sex with other men (MSM).  You are a heterosexual man who is sexually active with multiple partners.  You take drugs by injection.  You are sexually active with a partner who has HIV.  Talk with your health care provider about whether you are at high risk of being infected with HIV. If you choose to begin PrEP, you should first be tested for HIV. You should then be tested  every 3 months for as long as you are taking PrEP.  Use sunscreen. Apply sunscreen liberally and repeatedly throughout the day. You should seek shade when your shadow is shorter than you. Protect yourself by wearing long sleeves, pants, a wide-brimmed hat, and sunglasses year round whenever you are outdoors.  Tell your health care provider of new moles or changes in moles, especially if there is a change in shape or color. Also, tell your health care provider if a mole is larger than the size of a pencil eraser.  A one-time screening for abdominal aortic aneurysm (AAA) and surgical repair of large AAAs by ultrasound is recommended for men aged 65-75 years who are current or former smokers.  Stay current with your vaccines (immunizations). This information is not intended to replace advice given to you by your health care provider. Make sure you discuss any questions you have with your health care provider. Document Released: 12/18/2007 Document Revised: 07/12/2014 Document Reviewed: 03/25/2015 Elsevier Interactive Patient Education  2017 [employer]  

## 2016-08-27 NOTE — Progress Notes (Signed)
BP 116/72   Pulse 72   Temp 98.3 F (36.8 C) (Oral)   Ht 5\' 11"  (1.803 m)   Wt 207 lb 12 oz (94.2 kg)   BMI 28.98 kg/m    CC: CPE Subjective:    Patient ID: Clarence Moore, male    DOB: 1965/07/11, 51 y.o.   MRN: [id number]  HPI: DEVORIS WIESE is a 51 y.o. male presenting on 08/27/2016 for Annual Exam   Recent cervical LAD - that has improved with improved dental hygiene. Dentist thinks he may have cracked root #31 contributing. Saw oral surgery - pending tooth removed.   Recent GI bug - improved with IBGuard.   Preventative: COLONOSCOPY 08/2014; HP, int hem, diverticulosis, rec rpt 5 yrs ([facility]) Prostate cancer - family history (father with prostate issues since age 97s). Request QOY. Flu shot - declines Tdap 2016 Seat belt use discussed.  Sunscreen use discussed. No changing moles on skin. Due to f/u with derm.  Non smoker Alcohol - none  Caffeine: 2 cups coffee/day  Lives with wife, daughter, outdoor pets  Occupation: distribution Tourist information centre manager  Edu: college  Activity: cuts wood, dirtbike, walks at work 10,000 steps Diet: good water, fruits/vegetables daily   Relevant past medical, surgical, family and social history reviewed and updated as indicated. Interim medical history since our last visit reviewed. Allergies and medications reviewed and updated.  Outpatient Medications Prior to Visit  Medication Sig Dispense Refill  . cetirizine (ZYRTEC) 10 MG tablet Take 10 mg by mouth daily.     No facility-administered medications prior to visit.      Per HPI unless specifically indicated in ROS section below Review of Systems  Constitutional: Negative for activity change, appetite change, chills, fatigue, fever and unexpected weight change.  HENT: Negative for hearing loss.   Eyes: Negative for visual disturbance.  Respiratory: Negative for cough, chest tightness, shortness of breath and wheezing.   Cardiovascular: Negative for chest pain,  palpitations and leg swelling.  Gastrointestinal: Positive for abdominal pain and diarrhea. Negative for abdominal distention, blood in stool, constipation, nausea and vomiting.       Recent GI bug  Genitourinary: Negative for difficulty urinating and hematuria.  Musculoskeletal: Negative for arthralgias, myalgias and neck pain.  Skin: Negative for rash.  Neurological: Negative for dizziness, seizures, syncope and headaches.  Hematological: Positive for adenopathy (R cervical). Does not bruise/bleed easily.  Psychiatric/Behavioral: Negative for dysphoric mood. The patient is not nervous/anxious.        Objective:    BP 116/72   Pulse 72   Temp 98.3 F (36.8 C) (Oral)   Ht 5\' 11"  (1.803 m)   Wt 207 lb 12 oz (94.2 kg)   BMI 28.98 kg/m   Wt Readings from Last 3 Encounters:  08/27/16 207 lb 12 oz (94.2 kg)  06/01/16 207 lb 8 oz (94.1 kg)  08/25/15 204 lb (92.5 kg)    Physical Exam  Constitutional: He is oriented to person, place, and time. He appears well-developed and well-nourished. No distress.  HENT:  Head: Normocephalic and atraumatic.  Right Ear: Hearing, tympanic membrane, external ear and ear canal normal.  Left Ear: Hearing, tympanic membrane, external ear and ear canal normal.  Nose: Nose normal.  Mouth/Throat: Uvula is midline, oropharynx is clear and moist and mucous membranes are normal. No oropharyngeal exudate, posterior oropharyngeal edema or posterior oropharyngeal erythema.  Eyes: Conjunctivae and EOM are normal. Pupils are equal, round, and reactive to light. No scleral icterus.  Neck:  Normal range of motion. Neck supple. No thyromegaly present.  Cardiovascular: Normal rate, regular rhythm, normal heart sounds and intact distal pulses.   No murmur heard. Pulses:      Radial pulses are 2+ on the right side, and 2+ on the left side.  Pulmonary/Chest: Effort normal and breath sounds normal. No respiratory distress. He has no wheezes. He has no rales.  Abdominal:  Soft. Bowel sounds are normal. He exhibits no distension and no mass. There is no tenderness. There is no rebound and no guarding.  Genitourinary: Rectum normal. Rectal exam shows no external hemorrhoid, no internal hemorrhoid, no fissure, no mass, no tenderness and anal tone normal. Prostate is not enlarged and not tender.  Musculoskeletal: Normal range of motion. He exhibits no edema.  Lymphadenopathy:    He has cervical adenopathy (R inferior AC).  Neurological: He is alert and oriented to person, place, and time.  CN grossly intact, station and gait intact  Skin: Skin is warm and dry. No rash noted.  Multiple nevi throughout body  Psychiatric: He has a normal mood and affect. His behavior is normal. Judgment and thought content normal.  Nursing note and vitals reviewed.  Results for orders placed or performed in visit on 08/23/16  Comprehensive metabolic panel  Result Value Ref Range   Sodium 140 135 - 145 mEq/L   Potassium 4.4 3.5 - 5.1 mEq/L   Chloride 104 96 - 112 mEq/L   CO2 30 19 - 32 mEq/L   Glucose, Bld 88 70 - 99 mg/dL   BUN 21 6 - 23 mg/dL   Creatinine, Ser 0.91 0.40 - 1.50 mg/dL   Total Bilirubin 0.5 0.2 - 1.2 mg/dL   Alkaline Phosphatase 81 39 - 117 U/L   AST 19 0 - 37 U/L   ALT 26 0 - 53 U/L   Total Protein 6.7 6.0 - 8.3 g/dL   Albumin 4.3 3.5 - 5.2 g/dL   Calcium 9.5 8.4 - 10.5 mg/dL   GFR 93.33 >60.00 mL/min  Lipid panel  Result Value Ref Range   Cholesterol 153 0 - 200 mg/dL   Triglycerides 114.0 0.0 - 149.0 mg/dL   HDL 40.50 >39.00 mg/dL   VLDL 22.8 0.0 - 40.0 mg/dL   LDL Cholesterol 90 0 - 99 mg/dL   Total CHOL/HDL Ratio 4    NonHDL 112.47   PSA  Result Value Ref Range   PSA 1.28 0.10 - 4.00 ng/mL      Assessment & Plan:   Problem List Items Addressed This Visit    Healthcare maintenance - Primary    Preventative protocols reviewed and updated unless pt declined. Discussed healthy diet and lifestyle.       LAD (lymphadenopathy) of right  cervical region    Thought dental related. Seeing oral surgery.           Follow up plan: Return in about 1 year (around 08/27/2017) for annual exam, prior fasting for blood work.  Ria Bush, MD

## 2016-12-01 ENCOUNTER — Encounter: Payer: Self-pay | Admitting: Family Medicine

## 2016-12-01 ENCOUNTER — Ambulatory Visit (INDEPENDENT_AMBULATORY_CARE_PROVIDER_SITE_OTHER): Payer: BLUE CROSS/BLUE SHIELD | Admitting: Family Medicine

## 2016-12-01 VITALS — BP 100/67 | HR 81 | Temp 98.4°F | Ht 71.0 in | Wt 207.8 lb

## 2016-12-01 DIAGNOSIS — R590 Localized enlarged lymph nodes: Secondary | ICD-10-CM

## 2016-12-01 NOTE — Patient Instructions (Addendum)
We will set you up for an ultrasound of your neck asap- I will be in touch with your results when they come in and we can plan the next step.  I suspect that you still have node inflammation from your recent dental issue but we will make sure there is nothing more concerning going on!    Let me know if any change in your condition or if you do not hear about your ultrasound soon

## 2016-12-01 NOTE — Progress Notes (Addendum)
[facility] [address] [place], [place] [address] [phone]  Date:  12/01/2016   Name:  Clarence Moore   DOB:  Dec 24, 1965   MRN:  [id number]  PCP:  Ria Bush, MD    Chief Complaint: Mass (In throat)   History of Present Illness:  Clarence Moore is a 51 y.o. very pleasant male patient who presents with the following:  Back in February he had noted a possible enlarged node in his RIGHT neck. This was though to be due to dental disease-  The problematic tooth has been pulled.  However he has noted persistent enlargement of this mass in the right side of his neck, and has started to feel like it was getting bigger.  He got concerned and decided to have it evaluated  The area can be uncomfortable if he moves his neck in a certain way.  He will also feel like something in this area "has stretched" at times which can be painful/ uncomfortable No swallowing difficulty expect he feels like "things can go down the wrong tube more easily lately." Never a smoker, no other tobacco  Rare alcohol No weight loss, no night sweats, no other particular sx noted   BP Readings from Last 3 Encounters:  12/01/16 100/67  08/27/16 116/72  06/01/16 116/78     Wt Readings from Last 3 Encounters:  12/01/16 207 lb 12.8 oz (94.3 kg)  08/27/16 207 lb 12 oz (94.2 kg)  06/01/16 207 lb 8 oz (94.1 kg)    Patient Active Problem List   Diagnosis Date Noted  . LAD (lymphadenopathy) of right cervical region 08/27/2016  . Overweight 08/25/2015  . Multiple nevi 08/25/2015  . Healthcare maintenance 03/06/2013  . Family history of prostate cancer 03/06/2013  . Family history of colon cancer 10/16/2012  . History of kidney stones 07/05/2012    Past Medical History:  Diagnosis Date  . ED (erectile dysfunction)    per uro  . Left ureteral calculus 11/2012   68mm, s/p stent placement [name])    Past Surgical History:  Procedure  Laterality Date  . COLONOSCOPY  2003   WNL ([place])  . COLONOSCOPY  08/2014   HP, int hem, diverticulosis, rec rpt 5 yrs ([facility])  . CYSTOSCOPY WITH RETROGRADE PYELOGRAM, URETEROSCOPY AND STENT PLACEMENT Left 11/20/2012   Procedure: CYSTOSCOPY WITH RETROGRADE PYELOGRAM, URETEROSCOPY AND STENT PLACEMENT;  Surgeon: Molli Hazard, MD;  Location: [facility];  Service: Urology;  Laterality: Left;  CYSTO, (L) URETEROSCOPY, LASER LITHO, LEFT RETROGRADE PYELOGRAM, POSSIBLE LEFT URETER STENT,    . HOLMIUM LASER APPLICATION Left 07/31/5168   Procedure: HOLMIUM LASER APPLICATION;  Surgeon: Molli Hazard, MD;  Location: [facility];  Service: Urology;  Laterality: Left;  . LITHOTRIPSY  11/2012   [facility]  . NASAL TURBINATE REDUCTION  2009  . TONSILLECTOMY AND ADENOIDECTOMY  AGE 8    Social History  Substance Use Topics  . Smoking status: Never Smoker  . Smokeless tobacco: Never Used  . Alcohol use No    Family History  Problem Relation Age of Onset  . Cancer Paternal Grandmother 64       colon  . Colon polyps Paternal Grandfather   . Alzheimer's disease Paternal Grandfather   . Cancer Paternal Grandfather        prostate  . Colon polyps Father   . Cancer Father  skin/esophageal  . Other Father 110       prostate issues  . Cancer Mother 79       lung, non small cell (smoker)  . Diabetes Neg Hx   . CAD Neg Hx   . Stroke Neg Hx     Allergies  Allergen Reactions  . Penicillins Anaphylaxis    Medication list has been reviewed and updated.  Current Outpatient Prescriptions on File Prior to Visit  Medication Sig Dispense Refill  . cetirizine (ZYRTEC) 10 MG tablet Take 10 mg by mouth daily.    . Multiple Vitamins-Minerals (MULTIVITAMIN ADULT EXTRA C) CHEW Chew 1 tablet by mouth daily.     No current facility-administered medications on file prior to visit.     Review of Systems:  As per HPI- otherwise negative. He is  generally in good health Positive family history of various cancers No CP, no SOB, no rash, no fever or chills Pt states that his BP is generally on the lower side    Physical Examination: Vitals:   12/01/16 1551  BP: 100/67  Pulse: 81  Temp: 98.4 F (36.9 C)   Vitals:   12/01/16 1551  Weight: 207 lb 12.8 oz (94.3 kg)  Height: 5\' 11"  (1.803 m)   Body mass index is 28.98 kg/m. Ideal Body Weight: Weight in (lb) to have BMI = 25: 178.9  GEN: WDWN, NAD, Non-toxic, A & O x 3, normal weight, looks well HEENT: Atraumatic, Normocephalic. Neck supple. No masses, No LAD.  Bilateral TM wnl, oropharynx normal.  PEERL,EOMI.   Right mandibular molar has been extracted- this area appears to be healing normally  He has a palpable anterior cervical node on the RIGHT- feels less than 1 cm to me and does not feel pathologic  Ears and Nose: No external deformity. CV: RRR, No M/G/R. No JVD. No thrill. No extra heart sounds. PULM: CTA B, no wheezes, crackles, rhonchi. No retractions. No resp. distress. No accessory muscle use. ABD: S, NT, ND EXTR: No c/c/e NEURO Normal gait.  PSYCH: Normally interactive. Conversant. Not depressed or anxious appearing.  Calm demeanor.    Assessment and Plan: Cervical lymphadenopathy - Plan: US Soft Tissue Head/Neck  Referral for [place] to eval node which has been persistent and is concerning to pt.   Will follow-up with him pending this result.  Discussed possible next steps- observation, CT, biopsy He will alert me if any change in his sx   Signed Lamar Blinks, MD  Received his [place] result 6/10-  US Soft Tissue Head/neck  Result Date: 12/11/2016 CLINICAL DATA:  Palpable right neck mass since February of this year. Tender to touch. EXAM: ULTRASOUND OF HEAD/NECK SOFT TISSUES TECHNIQUE: Ultrasound examination of the head and neck soft tissues was performed in the area of clinical concern. COMPARISON:  None. FINDINGS: The patient's palpable area of concern  correlates with an approximately 2.4 x 0.7 x 1.9 cm well-defined hypoechoic structure within the subcutaneous tissues about the right side of the neck. IMPRESSION: A well-defined approximately 2.4 cm subcutaneous structure correlates with the patient's palpable area of concern - this structure remains indeterminate on this examination. Further evaluation with contrast-enhanced neck CT could be performed as clinically indicated. Electronically Signed   By: Sandi Mariscal M.D.   On: 12/11/2016 13:54   Message to pt- will order a CT for him.

## 2016-12-01 NOTE — Progress Notes (Signed)
Pre visit review using our clinic tool,if applicable. No additional management support is needed unless otherwise documented below in the visit note.   Patient complains of hx of limp in throat.

## 2016-12-11 ENCOUNTER — Ambulatory Visit (HOSPITAL_BASED_OUTPATIENT_CLINIC_OR_DEPARTMENT_OTHER)
Admission: RE | Admit: 2016-12-11 | Discharge: 2016-12-11 | Disposition: A | Discharge status: Home / Self Care | Payer: BLUE CROSS/BLUE SHIELD | Source: Ambulatory Visit | Attending: Family Medicine | Admitting: Family Medicine

## 2016-12-11 DIAGNOSIS — R221 Localized swelling, mass and lump, neck: Secondary | ICD-10-CM | POA: Diagnosis not present

## 2016-12-11 DIAGNOSIS — R590 Localized enlarged lymph nodes: Secondary | ICD-10-CM

## 2016-12-12 ENCOUNTER — Encounter: Payer: Self-pay | Admitting: Family Medicine

## 2016-12-12 NOTE — Addendum Note (Signed)
Addended by: Lamar Blinks on: 12/12/2016 06:20 AM   Modules accepted: Orders

## 2016-12-13 ENCOUNTER — Other Ambulatory Visit: Payer: Self-pay | Admitting: Family Medicine

## 2016-12-13 ENCOUNTER — Encounter: Payer: Self-pay | Admitting: Family Medicine

## 2016-12-13 ENCOUNTER — Ambulatory Visit (HOSPITAL_BASED_OUTPATIENT_CLINIC_OR_DEPARTMENT_OTHER)
Admission: RE | Admit: 2016-12-13 | Discharge: 2016-12-13 | Disposition: A | Discharge status: Home / Self Care | Payer: BLUE CROSS/BLUE SHIELD | Source: Ambulatory Visit | Attending: Family Medicine | Admitting: Family Medicine

## 2016-12-13 DIAGNOSIS — R590 Localized enlarged lymph nodes: Secondary | ICD-10-CM | POA: Insufficient documentation

## 2016-12-13 DIAGNOSIS — R221 Localized swelling, mass and lump, neck: Secondary | ICD-10-CM | POA: Diagnosis not present

## 2016-12-13 MED ORDER — IOPAMIDOL (ISOVUE-300) INJECTION 61%
100.0000 mL | Freq: Once | INTRAVENOUS | Status: AC | PRN
  Administered 2016-12-13: 75 mL via INTRAVENOUS

## 2016-12-14 ENCOUNTER — Other Ambulatory Visit: Payer: Self-pay | Admitting: Family Medicine

## 2016-12-14 DIAGNOSIS — R221 Localized swelling, mass and lump, neck: Secondary | ICD-10-CM

## 2016-12-14 NOTE — Telephone Encounter (Signed)
Called and D/w IR physician and then pt. reasonable to go ahead with tissue bx per IR, then will plan next step

## 2016-12-15 ENCOUNTER — Other Ambulatory Visit: Payer: Self-pay | Admitting: Radiology

## 2016-12-16 ENCOUNTER — Other Ambulatory Visit: Payer: Self-pay | Admitting: Family Medicine

## 2016-12-16 ENCOUNTER — Encounter: Payer: Self-pay | Admitting: Family Medicine

## 2016-12-16 ENCOUNTER — Ambulatory Visit (HOSPITAL_COMMUNITY)
Admission: RE | Admit: 2016-12-16 | Discharge: 2016-12-16 | Disposition: A | Discharge status: Home / Self Care | Payer: BLUE CROSS/BLUE SHIELD | Source: Ambulatory Visit | Attending: Family Medicine | Admitting: Family Medicine

## 2016-12-16 DIAGNOSIS — Z9089 Acquired absence of other organs: Secondary | ICD-10-CM | POA: Insufficient documentation

## 2016-12-16 DIAGNOSIS — N529 Male erectile dysfunction, unspecified: Secondary | ICD-10-CM | POA: Insufficient documentation

## 2016-12-16 DIAGNOSIS — Z800 Family history of malignant neoplasm of digestive organs: Secondary | ICD-10-CM | POA: Insufficient documentation

## 2016-12-16 DIAGNOSIS — Z808 Family history of malignant neoplasm of other organs or systems: Secondary | ICD-10-CM | POA: Insufficient documentation

## 2016-12-16 DIAGNOSIS — Z8042 Family history of malignant neoplasm of prostate: Secondary | ICD-10-CM | POA: Insufficient documentation

## 2016-12-16 DIAGNOSIS — Z880 Allergy status to penicillin: Secondary | ICD-10-CM | POA: Insufficient documentation

## 2016-12-16 DIAGNOSIS — R221 Localized swelling, mass and lump, neck: Secondary | ICD-10-CM | POA: Diagnosis not present

## 2016-12-16 DIAGNOSIS — D181 Lymphangioma, any site: Secondary | ICD-10-CM | POA: Diagnosis not present

## 2016-12-16 DIAGNOSIS — Z8371 Family history of colonic polyps: Secondary | ICD-10-CM | POA: Diagnosis not present

## 2016-12-16 DIAGNOSIS — Z801 Family history of malignant neoplasm of trachea, bronchus and lung: Secondary | ICD-10-CM | POA: Insufficient documentation

## 2016-12-16 DIAGNOSIS — Z9889 Other specified postprocedural states: Secondary | ICD-10-CM | POA: Diagnosis not present

## 2016-12-16 DIAGNOSIS — Z820 Family history of epilepsy and other diseases of the nervous system: Secondary | ICD-10-CM | POA: Diagnosis not present

## 2016-12-16 DIAGNOSIS — Z87442 Personal history of urinary calculi: Secondary | ICD-10-CM | POA: Diagnosis not present

## 2016-12-16 LAB — CBC
HCT: 42.9 % (ref 39.0–52.0)
HEMOGLOBIN: 14 g/dL (ref 13.0–17.0)
MCH: 29.8 pg (ref 26.0–34.0)
MCHC: 32.6 g/dL (ref 30.0–36.0)
MCV: 91.3 fL (ref 78.0–100.0)
Platelets: 259 10*3/uL (ref 150–400)
RBC: 4.7 MIL/uL (ref 4.22–5.81)
RDW: 12.5 % (ref 11.5–15.5)
WBC: 5.9 10*3/uL (ref 4.0–10.5)

## 2016-12-16 LAB — APTT: aPTT: 30 seconds (ref 24–36)

## 2016-12-16 LAB — PROTIME-INR
INR: 1.01
PROTHROMBIN TIME: 13.3 s (ref 11.4–15.2)

## 2016-12-16 MED ORDER — MIDAZOLAM HCL 2 MG/2ML IJ SOLN
INTRAMUSCULAR | Status: AC | PRN
  Administered 2016-12-16: 0.5 mg via INTRAVENOUS
  Administered 2016-12-16: 1 mg via INTRAVENOUS
  Administered 2016-12-16: 0.5 mg via INTRAVENOUS

## 2016-12-16 MED ORDER — FENTANYL CITRATE (PF) 100 MCG/2ML IJ SOLN
INTRAMUSCULAR | Status: AC
  Filled 2016-12-16: qty 2

## 2016-12-16 MED ORDER — LIDOCAINE-EPINEPHRINE 1 %-1:100000 IJ SOLN
INTRAMUSCULAR | Status: AC
  Filled 2016-12-16: qty 1

## 2016-12-16 MED ORDER — MIDAZOLAM HCL 2 MG/2ML IJ SOLN
INTRAMUSCULAR | Status: AC
  Filled 2016-12-16: qty 2

## 2016-12-16 MED ORDER — FENTANYL CITRATE (PF) 100 MCG/2ML IJ SOLN
INTRAMUSCULAR | Status: AC | PRN
  Administered 2016-12-16 (×2): 25 ug via INTRAVENOUS
  Administered 2016-12-16: 50 ug via INTRAVENOUS

## 2016-12-16 MED ORDER — SODIUM CHLORIDE 0.9 % IV SOLN
INTRAVENOUS | Status: AC | PRN
  Administered 2016-12-16: 10 mL/h via INTRAVENOUS

## 2016-12-16 MED ORDER — SODIUM CHLORIDE 0.9 % IV SOLN: INTRAVENOUS | Status: DC

## 2016-12-16 NOTE — Consult Note (Signed)
Chief Complaint: Patient was seen in consultation today for US guided right cervical lymph node biopsy  Referring Physician(s): Copland,Jessica C  Supervising Physician: Sandi Mariscal  Patient Status: [facility] - Out-pt  History of Present Illness: Clarence Moore is a 51 y.o. male with history of persistently enlarged right neck mass/node since January of this year. Recent CT revealed:  Hypoattenuating lesion within the lower right neck, at level 5A. The appearance of the mass is somewhat indeterminate, but it may indicate a necrotic lymph node, which would be concerning for metastatic disease from an occult head and neck carcinoma.  He presents today for US guided biopsy of the right neck mass/node. He has no hx of cancer and does not smoke or drink alcohol. He has received antibiotic treatment following tooth removal earlier this year.  Past Medical History:  Diagnosis Date  . ED (erectile dysfunction)    per uro  . Left ureteral calculus 11/2012   76mm, s/p stent placement [name])    Past Surgical History:  Procedure Laterality Date  . COLONOSCOPY  2003   WNL ([place])  . COLONOSCOPY  08/2014   HP, int hem, diverticulosis, rec rpt 5 yrs ([facility])  . CYSTOSCOPY WITH RETROGRADE PYELOGRAM, URETEROSCOPY AND STENT PLACEMENT Left 11/20/2012   Procedure: CYSTOSCOPY WITH RETROGRADE PYELOGRAM, URETEROSCOPY AND STENT PLACEMENT;  Surgeon: Molli Hazard, MD;  Location: [facility];  Service: Urology;  Laterality: Left;  CYSTO, (L) URETEROSCOPY, LASER LITHO, LEFT RETROGRADE PYELOGRAM, POSSIBLE LEFT URETER STENT,    . HOLMIUM LASER APPLICATION Left 9/67/8938   Procedure: HOLMIUM LASER APPLICATION;  Surgeon: Molli Hazard, MD;  Location: [facility];  Service: Urology;  Laterality: Left;  . LITHOTRIPSY  11/2012   [facility]  . NASAL TURBINATE REDUCTION  2009  . TONSILLECTOMY AND ADENOIDECTOMY  AGE 30     Allergies: Penicillins  Medications: Prior to Admission medications   Medication Sig Start Date End Date Taking? Authorizing Provider  cetirizine (ZYRTEC) 10 MG tablet Take 10 mg by mouth daily as needed for allergies.    Yes [provider]  Multiple Vitamins-Minerals (MULTIVITAMIN ADULT EXTRA C) CHEW Chew 1 tablet by mouth daily.   Yes [provider]     Family History  Problem Relation Age of Onset  . Cancer Paternal Grandmother 53       colon  . Colon polyps Paternal Grandfather   . Alzheimer's disease Paternal Grandfather   . Cancer Paternal Grandfather        prostate  . Colon polyps Father   . Cancer Father        skin/esophageal  . Other Father 76       prostate issues  . Cancer Mother 20       lung, non small cell (smoker)  . Diabetes Neg Hx   . CAD Neg Hx   . Stroke Neg Hx     Social History   Social History  . Marital status: Married    Spouse name: N/A  . Number of children: N/A  . Years of education: N/A   Social History Main Topics  . Smoking status: Never Smoker  . Smokeless tobacco: Never Used  . Alcohol use No  . Drug use: No  . Sexual activity: Not on file   Other Topics Concern  . Not on file   Social History Narrative   Caffeine: 2 cups coffee/day   Lives with wife, daughter, outdoor pets   Occupation: distribution center/warehouse  manager   Edu: college   Activity: cuts wood, dirtbike, walks at work   Diet: good water, fruits/vegetables daily      Review of Systems denies fever,HA,CP,dyspnea, cough, abd/back pain,N/V or bleeding  Vital Signs: BP (!) 127/93 (BP Location: Right Arm)   Pulse (!) 58   Temp 97.8 F (36.6 C) (Oral)   Ht 6' (1.829 m)   Wt 200 lb (90.7 kg)   SpO2 100%   BMI 27.12 kg/m   Physical Exam awake/alert; chest- CTA bilat; heart- sl bradycardic but reg rhythm; abd- soft,+BS,NT; ext- no edema; palpable small NT rt neck node  Mallampati Score:     Imaging: Ct Soft Tissue Neck W  Contrast  Result Date: 12/13/2016 CLINICAL DATA:  Right-sided neck mass and right mandibular pain. EXAM: CT NECK WITH CONTRAST TECHNIQUE: Multidetector CT imaging of the neck was performed using the standard protocol following the bolus administration of intravenous contrast. CONTRAST:  73mL ISOVUE-300 IOPAMIDOL (ISOVUE-300) INJECTION 61% COMPARISON:  Neck ultrasound 12/11/2016 FINDINGS: Pharynx and larynx: --Nasopharynx: Fossae of Rosenmuller are clear. Normal adenoid tonsils for age. --Oral cavity and oropharynx: There is a focal lucency at the extraction site of tooth two, the right second molar. No inflammatory changes in the adjacent fat. The U retromolar trigone is normal. PE visible tongue and floor of mouth are normal. --Hypopharynx: Normal vallecula and pyriform sinuses. --Larynx: Normal epiglottis and pre-epiglottic space. Normal aryepiglottic and vocal folds. --Retropharyngeal space: No abscess, effusion or lymphadenopathy. Salivary glands: The parotid, sublingual and submandibular glands are normal. No sialolithiasis or salivary ductal dilatation. Thyroid: Normal Lymph nodes: There is a hypoattenuating structure within the lower right neck, just deep to the posterior aspect of the sternocleidomastoid muscle, measuring 1.6 x 0.9 x 1.7 cm. There are multiple subcentimeter level IIa lymph nodes bilaterally. Vascular: The carotid and vertebral systems are patent. Both internal jugular veins are patent. Limited intracranial: Normal Visualized orbits: Normal Mastoids and visualized paranasal sinuses: Clear Skeleton: Unremarkable Upper chest: Clear Other: None IMPRESSION: 1. Hypoattenuating lesion within the lower right neck, at level 5A. The appearance of the mass is somewhat indeterminate, but it may indicate a necrotic lymph node, which would be concerning for metastatic disease from an occult head and neck carcinoma. Histologic sampling is recommended, with correlation with endoscopic evaluation if there  are findings of metastatic disease. 2. Focal lucency at the site of prior tooth#2 extraction site without surrounding inflammatory change. Electronically Signed   By: Ulyses Jarred M.D.   On: 12/13/2016 18:42   US Soft Tissue Head/neck  Result Date: 12/11/2016 CLINICAL DATA:  Palpable right neck mass since February of this year. Tender to touch. EXAM: ULTRASOUND OF HEAD/NECK SOFT TISSUES TECHNIQUE: Ultrasound examination of the head and neck soft tissues was performed in the area of clinical concern. COMPARISON:  None. FINDINGS: The patient's palpable area of concern correlates with an approximately 2.4 x 0.7 x 1.9 cm well-defined hypoechoic structure within the subcutaneous tissues about the right side of the neck. IMPRESSION: A well-defined approximately 2.4 cm subcutaneous structure correlates with the patient's palpable area of concern - this structure remains indeterminate on this examination. Further evaluation with contrast-enhanced neck CT could be performed as clinically indicated. Electronically Signed   By: Sandi Mariscal M.D.   On: 12/11/2016 13:54    Labs:  CBC:  Recent Labs  06/01/16 0954  WBC 10.7*  HGB 13.8  HCT 40.5  PLT 281.0    COAGS: No results for input(s): INR, APTT in the  last 8760 hours.  BMP:  Recent Labs  08/23/16 0852  NA 140  K 4.4  CL 104  CO2 30  GLUCOSE 88  BUN 21  CALCIUM 9.5  CREATININE 0.91    LIVER FUNCTION TESTS:  Recent Labs  08/23/16 0852  BILITOT 0.5  AST 19  ALT 26  ALKPHOS 81  PROT 6.7  ALBUMIN 4.3    TUMOR MARKERS: No results for input(s): AFPTM, CEA, CA199, CHROMGRNA in the last 8760 hours.  Assessment and Plan: 51 y.o. male with history of persistently enlarged right neck mass/node since January of this year. Recent CT revealed:  Hypoattenuating lesion within the lower right neck, at level 5A. The appearance of the mass is somewhat indeterminate, but it may indicate a necrotic lymph node, which would be concerning  for metastatic disease from an occult head and neck carcinoma.  He presents today for US guided biopsy of the right neck mass/node. He has no hx of cancer and does not smoke or drink alcohol. He has received antibiotic treatment following tooth removal earlier this year. Risks and benefits discussed with the patient/spouse including, but not limited to bleeding, infection, damage to adjacent structures or low yield requiring additional tests.All of the patient's questions were answered, patient is agreeable to proceed.Consent signed and in chart.     Thank you for this interesting consult.  I greatly enjoyed meeting Clarence Moore and look forward to participating in their care.  A copy of this report was sent to the requesting provider on this date.  Electronically Signed: D. Rowe Robert, PA-C 12/16/2016, 12:20 PM   I spent a total of  20 minutes   in face to face in clinical consultation, greater than 50% of which was counseling/coordinating care for US guided right neck mass/node biopsy

## 2016-12-16 NOTE — Discharge Instructions (Addendum)
Needle Biopsy, Care After °Refer to this sheet in the next few weeks. These instructions provide you with information about caring for yourself after your procedure. Your health care provider may also give you more specific instructions. Your treatment has been planned according to current medical practices, but problems sometimes occur. Call your health care provider if you have any problems or questions after your procedure. °What can I expect after the procedure? °After your procedure, it is common to have soreness, bruising, or mild pain at the biopsy site. This should go away in a few days. °Follow these instructions at home: °· Rest as directed by your health care provider. °· Take medicines only as directed by your health care provider. °· There are many different ways to close and cover the biopsy site, including stitches (sutures), skin glue, and adhesive strips. Follow your health care provider's instructions about: °? Biopsy site care. °? Bandage (dressing) changes and removal. °? Biopsy site closure removal. °· Check your biopsy site every day for signs of infection. Watch for: °? Redness, swelling, or pain. °? Fluid, blood, or pus. °Contact a health care provider if: °· You have a fever. °· You have redness, swelling, or pain at the biopsy site that lasts longer than a few days. °· You have fluid, blood, or pus coming from the biopsy site. °· You feel nauseous. °· You vomit. °Get help right away if: °· You have shortness of breath. °· You have trouble breathing. °· You have chest pain. °· You feel dizzy or you faint. °· You have bleeding that does not stop with pressure or a bandage. °· You cough up blood. °· You have pain in your abdomen. °This information is not intended to replace advice given to you by your health care provider. Make sure you discuss any questions you have with your health care provider. °Document Released: 11/05/2014 Document Revised: 11/27/2015 Document Reviewed:  06/17/2014 °Elsevier Interactive Patient Education © 2018 [employer] °Moderate Conscious Sedation, Adult, Care After °These instructions provide you with information about caring for yourself after your procedure. Your health care provider may also give you more specific instructions. Your treatment has been planned according to current medical practices, but problems sometimes occur. Call your health care provider if you have any problems or questions after your procedure. °What can I expect after the procedure? °After your procedure, it is common: °· To feel sleepy for several hours. °· To feel clumsy and have poor balance for several hours. °· To have poor judgment for several hours. °· To vomit if you eat too soon. ° °Follow these instructions at home: °For at least 24 hours after the procedure: ° °· Do not: °? Participate in activities where you could fall or become injured. °? Drive. °? Use heavy machinery. °? Drink alcohol. °? Take sleeping pills or medicines that cause drowsiness. °? Make important decisions or sign legal documents. °? Take care of children on your own. °· Rest. °Eating and drinking °· Follow the diet recommended by your health care provider. °· If you vomit: °? Drink water, juice, or soup when you can drink without vomiting. °? Make sure you have little or no nausea before eating solid foods. °General instructions °· Have a responsible adult stay with you until you are awake and alert. °· Take over-the-counter and prescription medicines only as told by your health care provider. °· If you smoke, do not smoke without supervision. °· Keep all follow-up visits as told by your health care   provider. This is important. °Contact a health care provider if: °· You keep feeling nauseous or you keep vomiting. °· You feel light-headed. °· You develop a rash. °· You have a fever. °Get help right away if: °· You have trouble breathing. °This information is not intended to replace advice given to you  by your health care provider. Make sure you discuss any questions you have with your health care provider. °Document Released: 04/11/2013 Document Revised: 11/24/2015 Document Reviewed: 10/11/2015 °Elsevier Interactive Patient Education © 2018 [employer] ° °

## 2016-12-16 NOTE — Procedures (Signed)
Pre Procedure Dx: Indeterminate palpable structure within the subcutaneous structures of the base of the right neck Post Procedural Dx: Lymphangioma  Technically successful US guided aspiration of lymphangioma within the base of the right side of the neck.  EBL: None  No immediate complications.   Ronny Bacon, MD Pager #: [phone]

## 2016-12-17 ENCOUNTER — Ambulatory Visit (HOSPITAL_COMMUNITY): Admission: RE | Admit: 2016-12-17 | Payer: BLUE CROSS/BLUE SHIELD | Source: Ambulatory Visit

## 2016-12-18 ENCOUNTER — Other Ambulatory Visit: Payer: Self-pay | Admitting: Family Medicine

## 2016-12-18 ENCOUNTER — Encounter: Payer: Self-pay | Admitting: Family Medicine

## 2016-12-18 DIAGNOSIS — R221 Localized swelling, mass and lump, neck: Secondary | ICD-10-CM

## 2017-01-03 DIAGNOSIS — R221 Localized swelling, mass and lump, neck: Secondary | ICD-10-CM | POA: Diagnosis not present

## 2017-07-19 DIAGNOSIS — R6889 Other general symptoms and signs: Secondary | ICD-10-CM | POA: Diagnosis not present

## 2017-07-19 DIAGNOSIS — R509 Fever, unspecified: Secondary | ICD-10-CM | POA: Diagnosis not present

## 2017-09-09 DIAGNOSIS — R221 Localized swelling, mass and lump, neck: Secondary | ICD-10-CM | POA: Diagnosis not present

## 2017-09-09 DIAGNOSIS — Q180 Sinus, fistula and cyst of branchial cleft: Secondary | ICD-10-CM | POA: Diagnosis not present

## 2017-11-18 IMAGING — US US SOFT TISSUE HEAD/NECK
1 series · 14 of 22 positions shown · non-contrast
Comparison: None.

CLINICAL DATA: Palpable right neck mass since [REDACTED]. Tender to touch.

EXAM:
ULTRASOUND OF HEAD/NECK SOFT TISSUES
TECHNIQUE: Ultrasound examination of the head and neck soft tissues was
performed in the area of clinical concern.

[Series 1: us soft tissue head/neck · 0.04mm/px · 22 acquisitions, 14 frames shown]
[im 1/22]
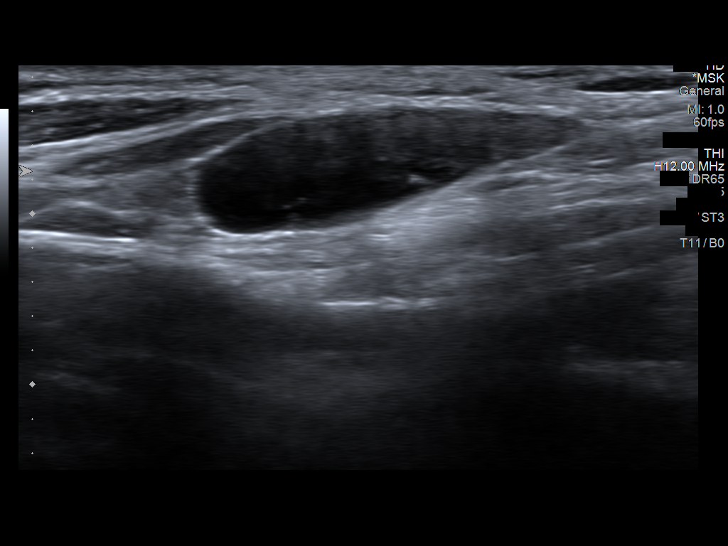
[im 3/22]
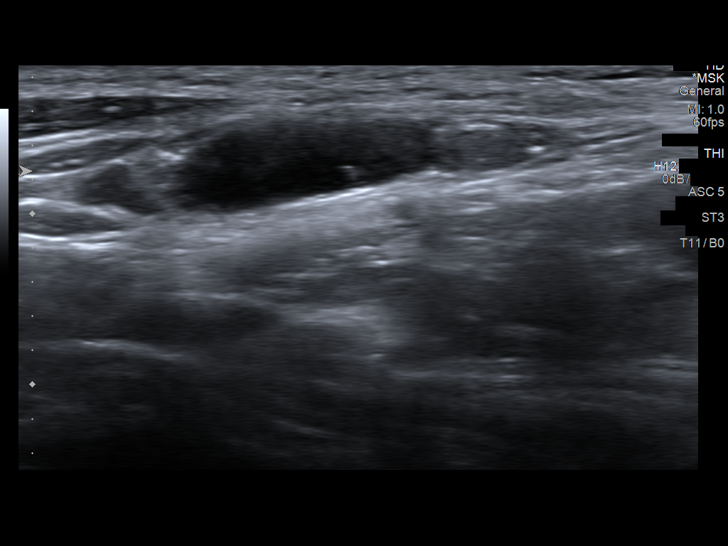
[im 4/22]
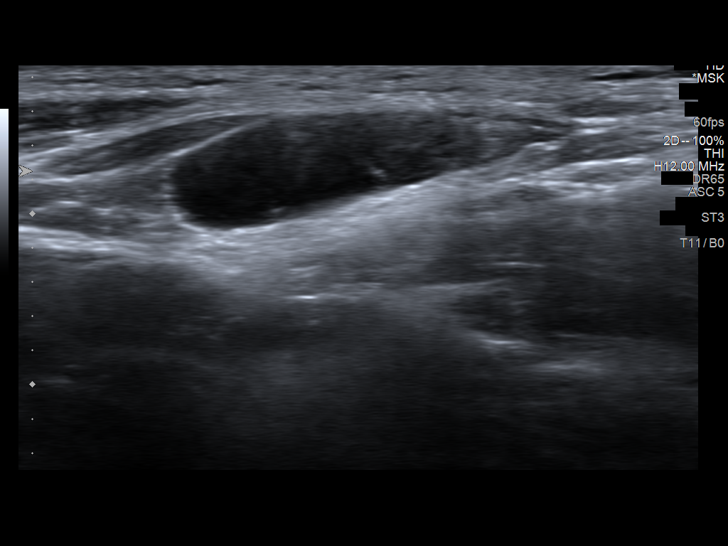
[im 6/22]
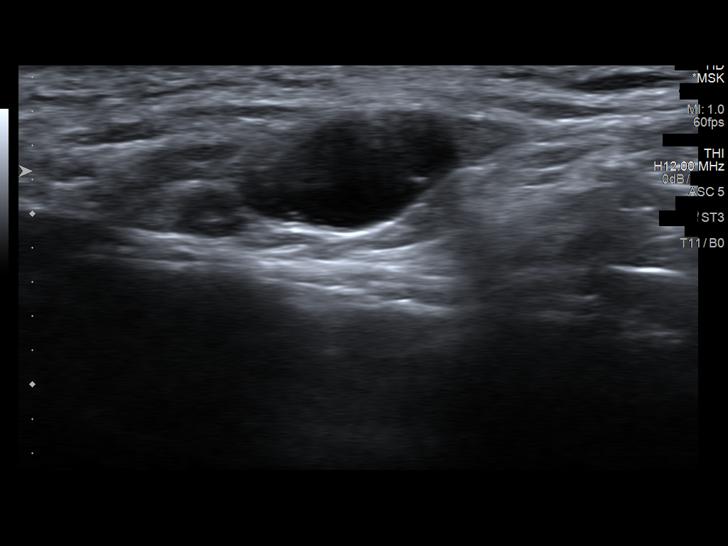
[im 8/22]
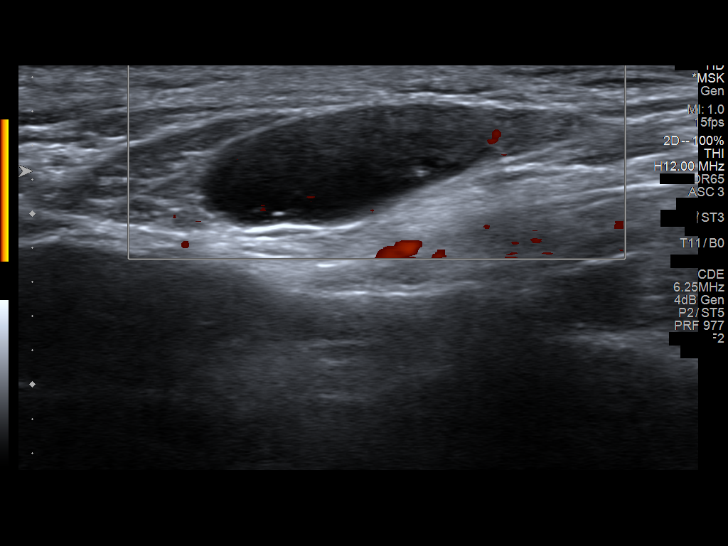
[im 9/22]
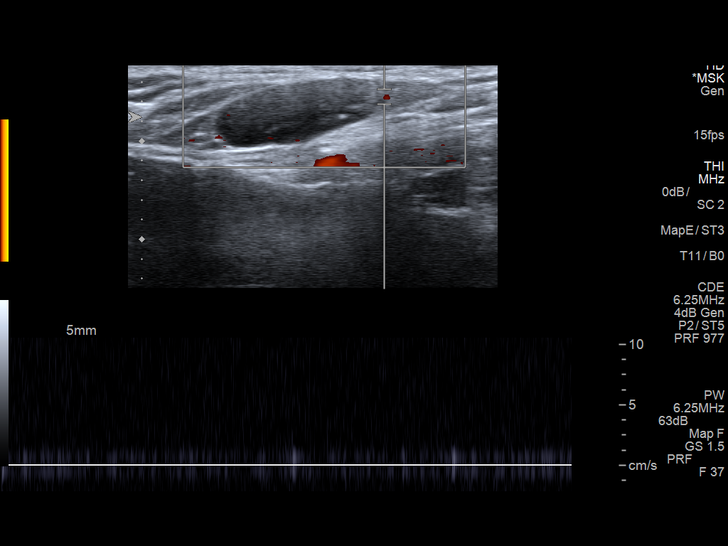
[im 11/22]
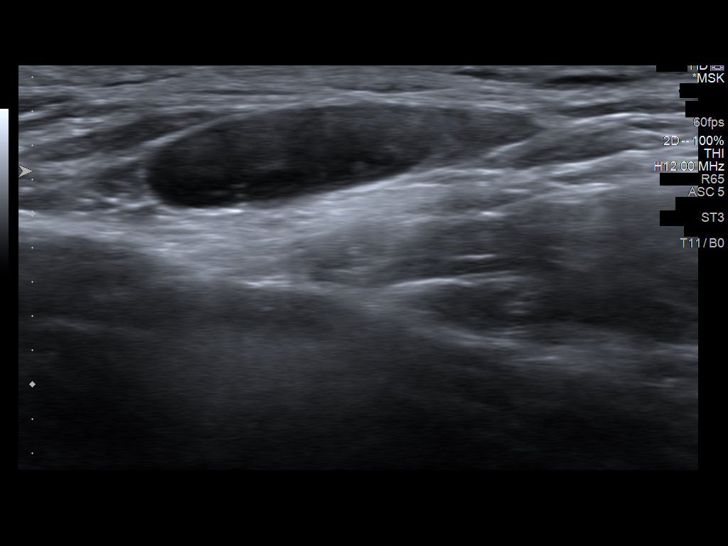
[im 12/22]
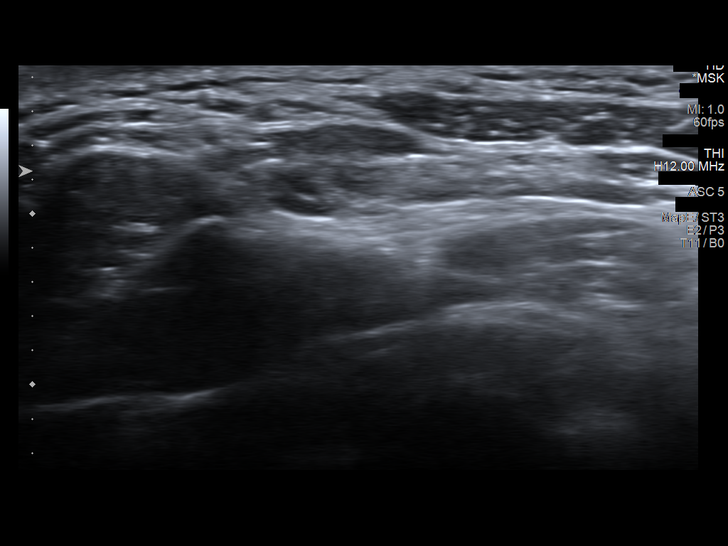
[im 14/22]
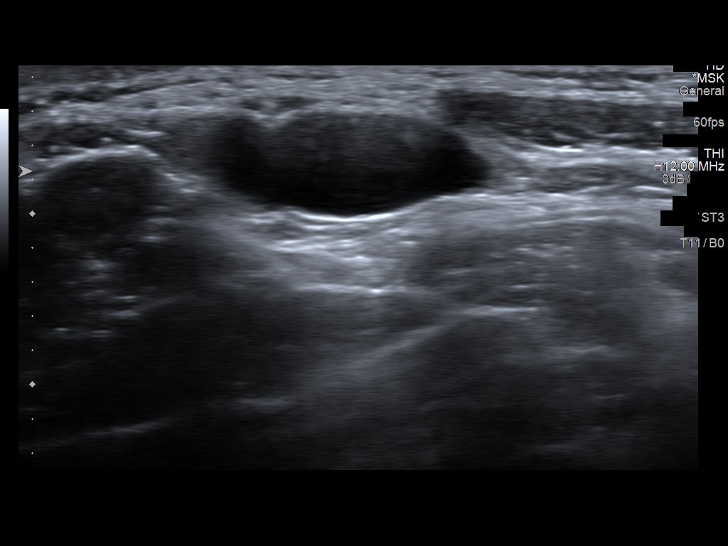
[im 15/22]
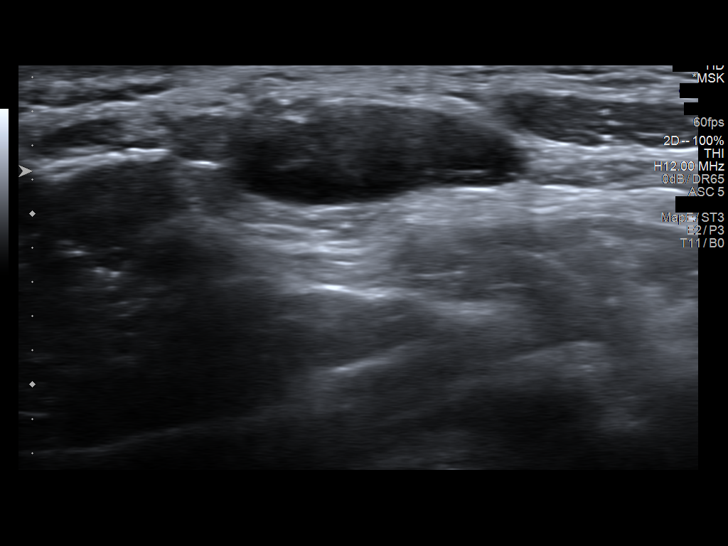
[im 17/22]
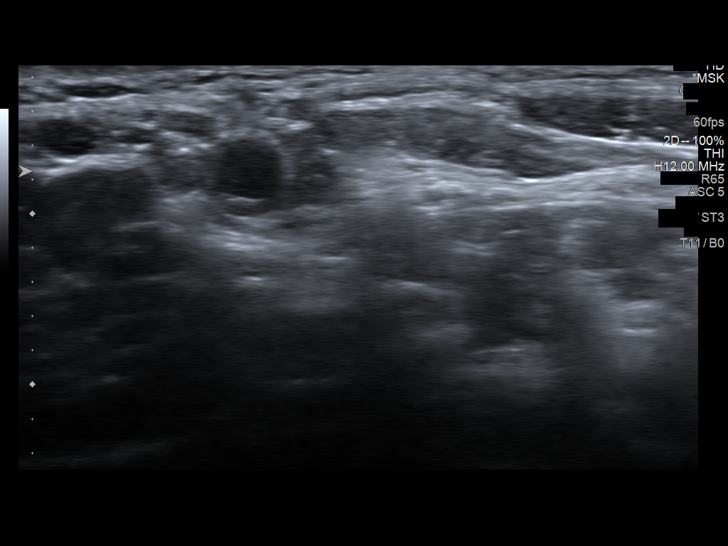
[im 19/22]
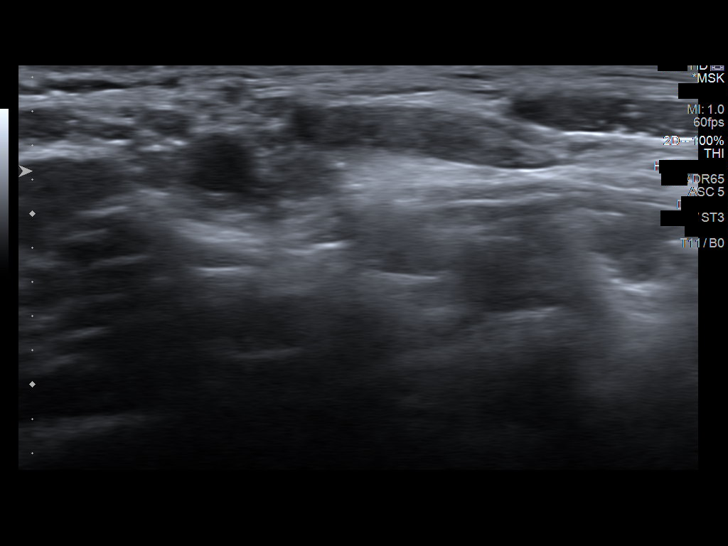
[im 20/22]
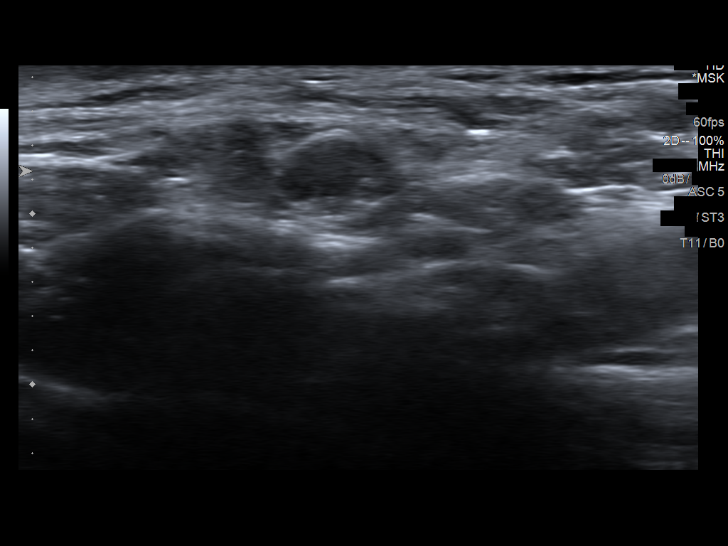
[im 22/22]
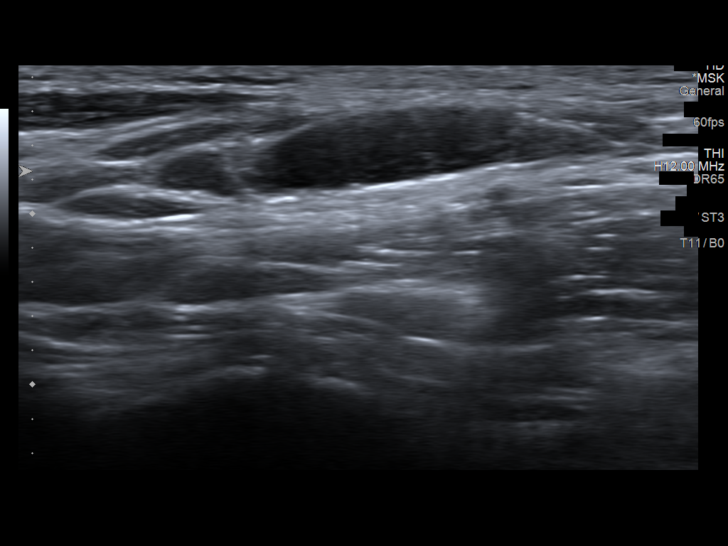

[14 of 22 positions shown; findings below may reference images not displayed]

FINDINGS: The patient's palpable area of concern correlates with an
approximately 2.4 x 0.7 x 1.9 cm well-defined hypoechoic structure
within the subcutaneous tissues about the right side of the neck.
IMPRESSION: A well-defined approximately 2.4 cm subcutaneous structure
correlates with the patient's palpable area of concern - this
structure remains indeterminate on this examination. Further
evaluation with contrast-enhanced neck CT could be performed as
clinically indicated.

## 2017-11-23 IMAGING — US US BIOPSY
1 series · 5 of 5 positions shown · non-contrast
Comparison: Soft tissue neck ultrasound - 12/11/2016;

INDICATION: Indeterminate palpable nodule within the subcutaneous soft tissues
of the right-side of the neck. Please from ultrasound-guided biopsy
for tissue diagnostic purposes.

EXAM:
ULTRASOUND GUIDED SUBCUTANEOUS NODULE BIOPSY WITHIN THE RIGHT-SIDE
OF THE NECK

[Series 1: us biopsy · 0.05mm/px · 5 of 5 slices shown]
[im 1/5]
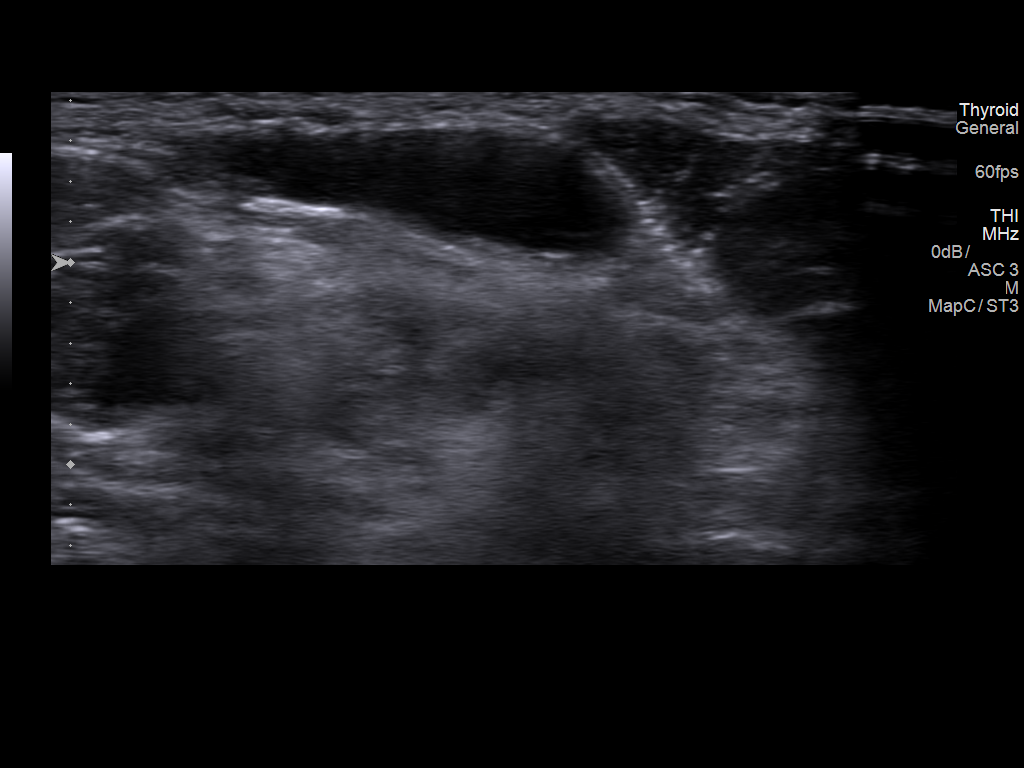
[im 2/5]
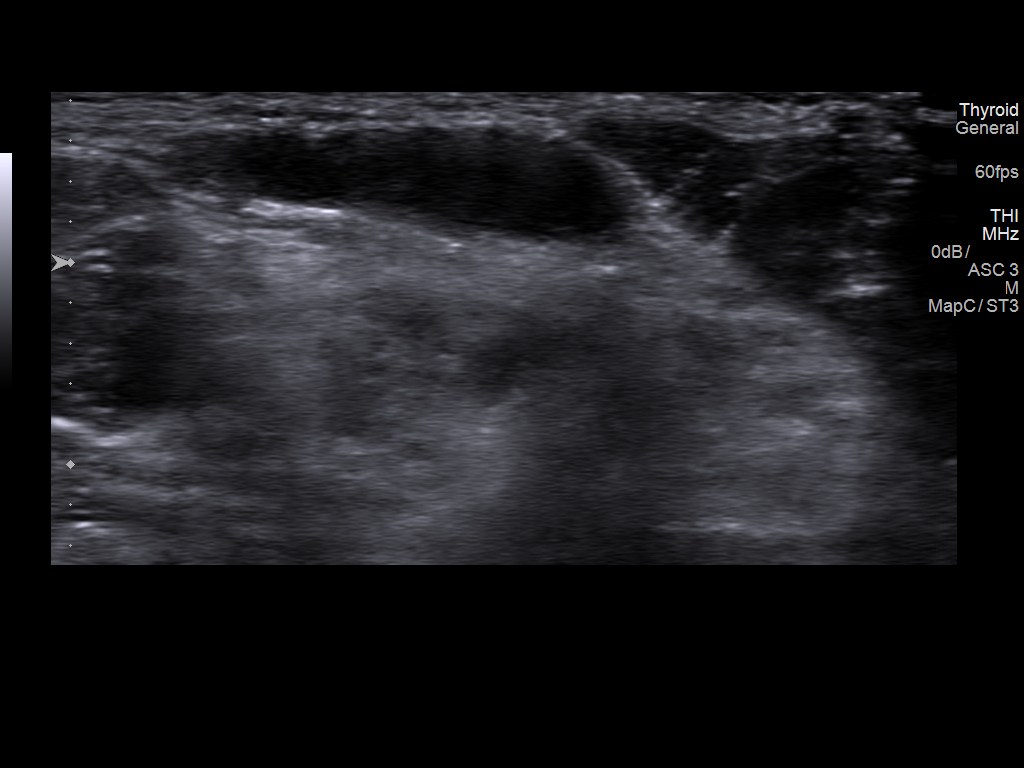
[im 3/5]
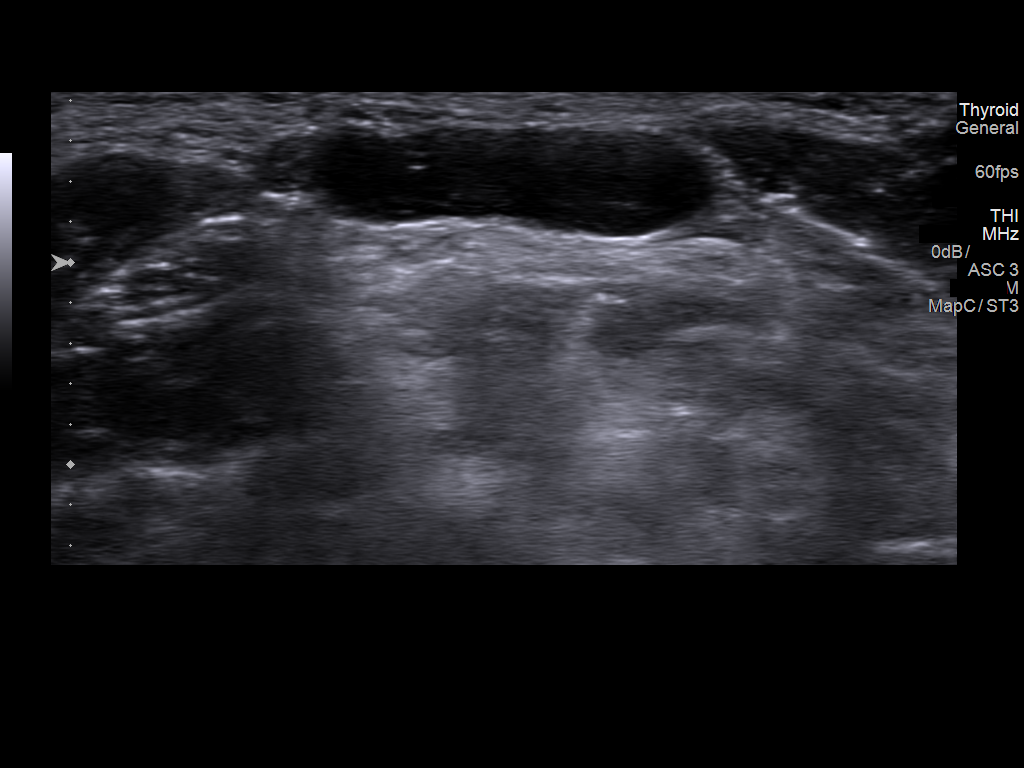
[im 4/5]
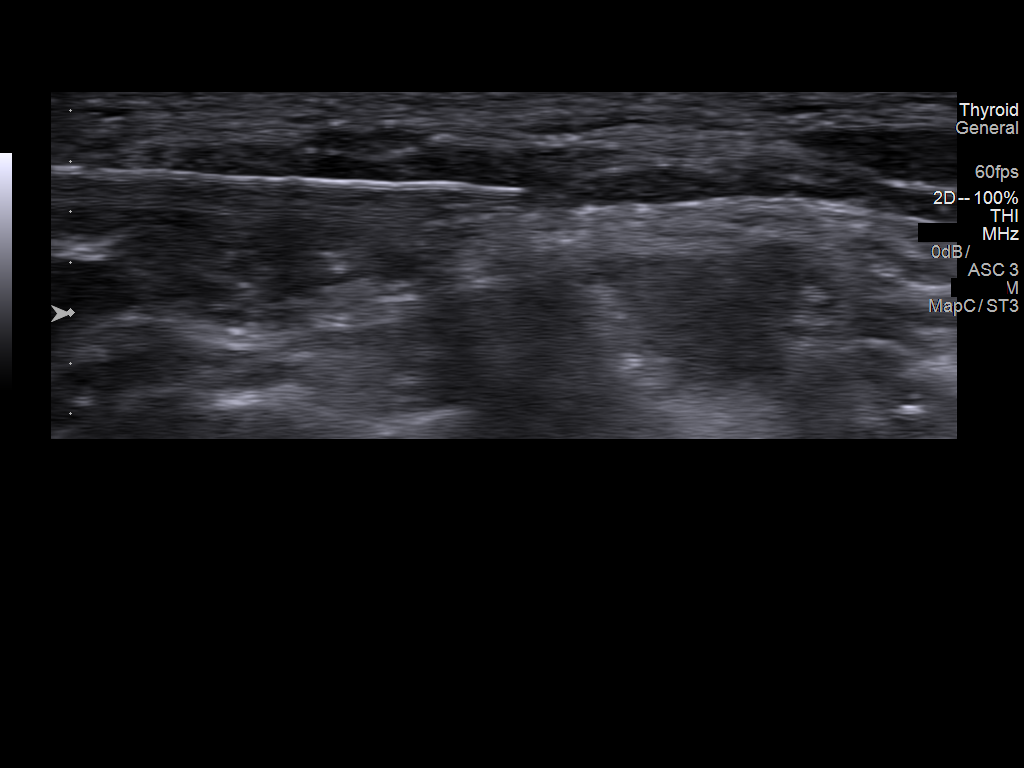
[im 5/5]
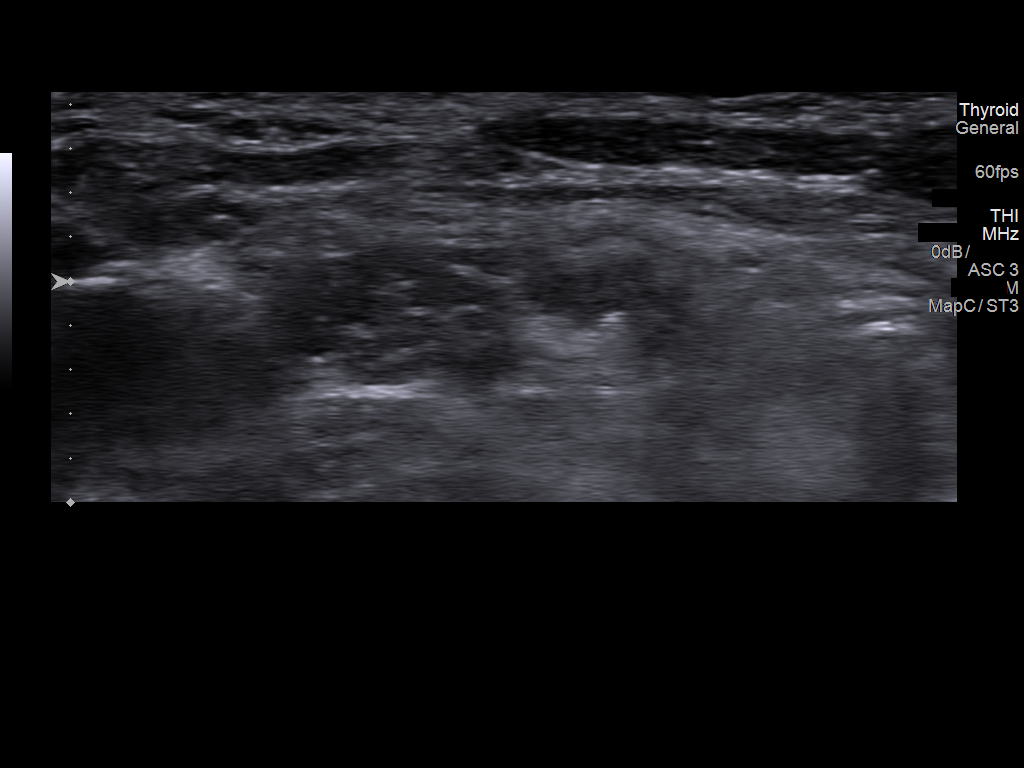

[5 of 5 positions shown; findings below may reference images not displayed]

contrast-enhanced neck CT - 12/13/2016

MEDICATIONS:
None

ANESTHESIA/SEDATION:
Fentanyl 100 mcg IV; Versed 2 mg IV

Total Moderate Sedation time: 11 minutes; The patient was
continuously monitored during the procedure by the interventional
radiology nurse under my direct supervision.

COMPLICATIONS:
None immediate.

PROCEDURE:
Informed written consent was obtained from the patient after a
discussion of the risks, benefits and alternatives to treatment. The
patient understands and consents the procedure. A timeout was
performed prior to the initiation of the procedure.

Ultrasound scanning was performed at the patient's palpable area of
concern demonstrating an approximately 2.0 x 0.5 cm fairly
well-defined hypoechoic structure correlating with the structural
seen on both preceding soft tissue neck ultrasound perform
12/11/2016 as well as contrast-enhanced neck CT performed
12/13/2016. The procedure was planned.

The skin overlying the base of the right lateral neck was prepped
and draped in usual sterile fashion. The overlying soft tissues were
anesthetized with 1% lidocaine with epinephrine.

Under direct ultrasound guidance, an 18 gauge core biopsy device was
advanced into the hypoechoic structure in a core needle biopsy was
obtained. Multiple ultrasound images were saved for procedural
documentation purposes.

Immediately following the acquisition of the initial biopsy fluid
was noted to leak from the dermal access site and the previous
identified hypoechoic subcutaneous nodule no longer visualized nor
palpable.

Unfortunately, the solitary core needle biopsy failed to yield any
diagnostic tissue.

At this point, the procedure was terminated. A dressing was placed.
The patient tolerated the procedure well without immediate
postprocedural complication.
IMPRESSION: The patient's palpable area of concern within the base of the right
lateral neck is cystic in composition and based on previous imaging
is favored to represent a lymphangioma.

This structure was completely decompressed following the initial
attempted core needle biopsy and no diagnostic material was acquired
given the cystic composition of this structure.

## 2018-01-16 DIAGNOSIS — D225 Melanocytic nevi of trunk: Secondary | ICD-10-CM | POA: Diagnosis not present

## 2018-01-16 DIAGNOSIS — D485 Neoplasm of uncertain behavior of skin: Secondary | ICD-10-CM | POA: Diagnosis not present

## 2018-01-16 DIAGNOSIS — D2271 Melanocytic nevi of right lower limb, including hip: Secondary | ICD-10-CM | POA: Diagnosis not present

## 2018-07-09 DIAGNOSIS — J208 Acute bronchitis due to other specified organisms: Secondary | ICD-10-CM | POA: Diagnosis not present

## 2018-07-09 DIAGNOSIS — H6693 Otitis media, unspecified, bilateral: Secondary | ICD-10-CM | POA: Diagnosis not present

## 2018-07-09 DIAGNOSIS — J019 Acute sinusitis, unspecified: Secondary | ICD-10-CM | POA: Diagnosis not present

## 2018-07-09 DIAGNOSIS — B9689 Other specified bacterial agents as the cause of diseases classified elsewhere: Secondary | ICD-10-CM | POA: Diagnosis not present

## 2018-08-23 IMAGING — CT CT NECK W/ CM
5 of 7 series · 13 of 33 positions shown, 15 images · IV contrast (iopamidol)
Comparison: Neck ultrasound 12/11/2016

CLINICAL DATA: Right-sided neck mass and right mandibular pain.

EXAM:
CT NECK WITH CONTRAST
TECHNIQUE: Multidetector CT imaging of the neck was performed using the
standard protocol following the bolus administration of intravenous
contrast.
CONTRAST:  75mL [id number] IOPAMIDOL ([id number]) INJECTION 61%

[Series 3: axial neck · axial · 0.50mm/px · z∈[+978,+1146]mm · 3 of 169 slices shown]
[im 43/169  bone]
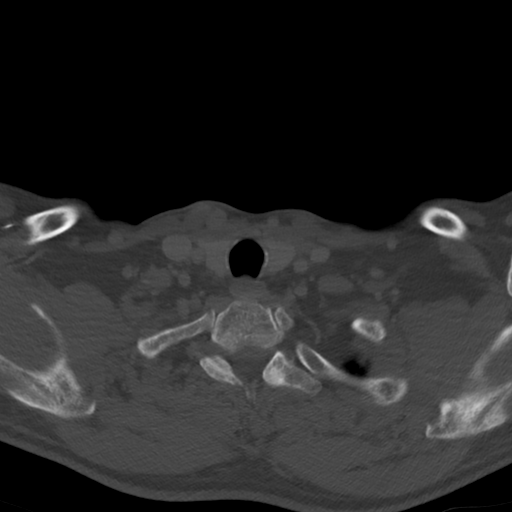
[im 85/169  bone]
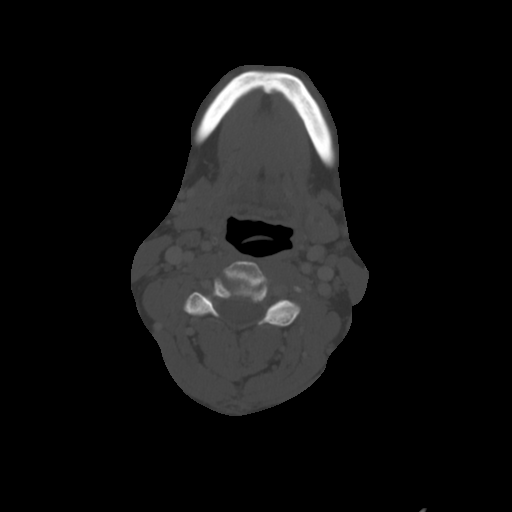
[im 127/169  bone]
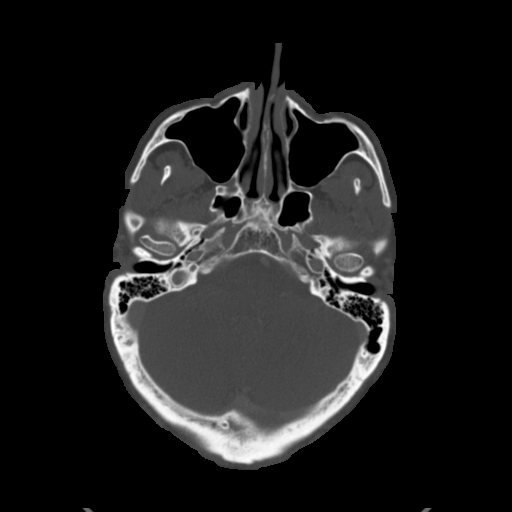

[Series 7: sag neck · sagittal · 0.57mm/px · 5 of 101 slices shown, 6 images]
[im 34/101  bone]
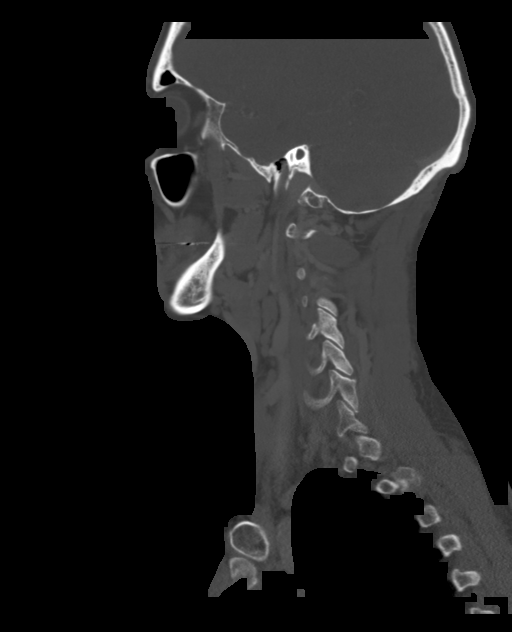
[im 42/101  bone]
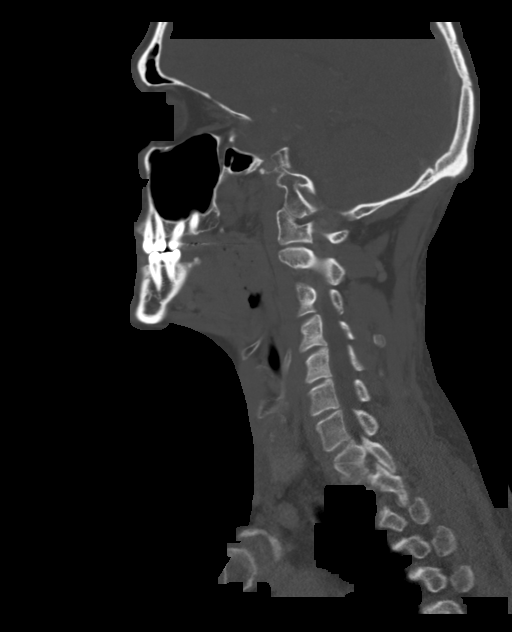
[im 51/101  soft-tissue]
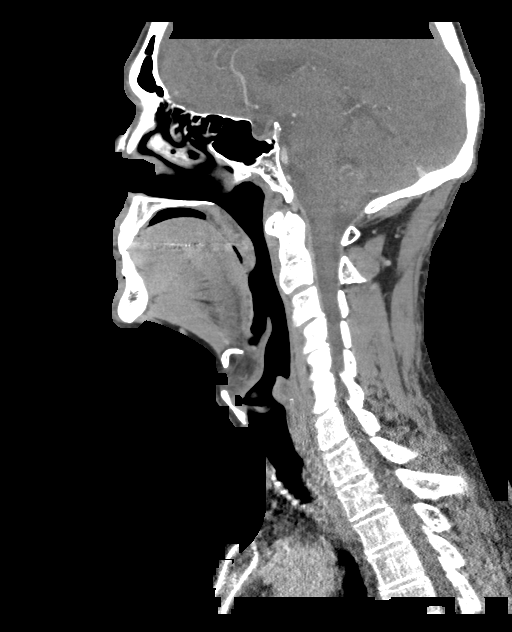
[im 51/101  bone]
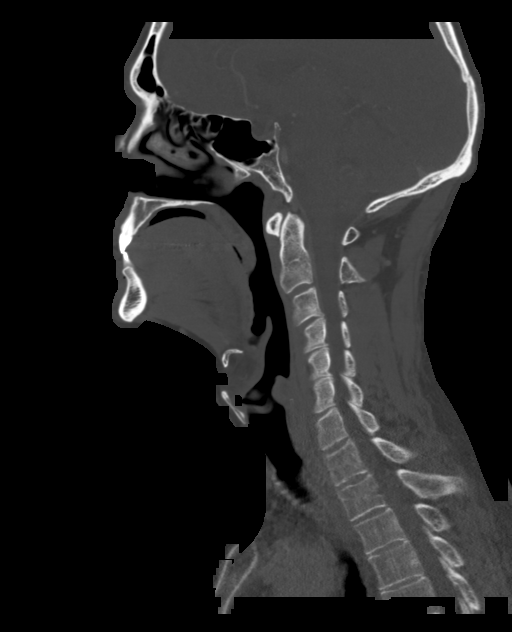
[im 59/101  bone]
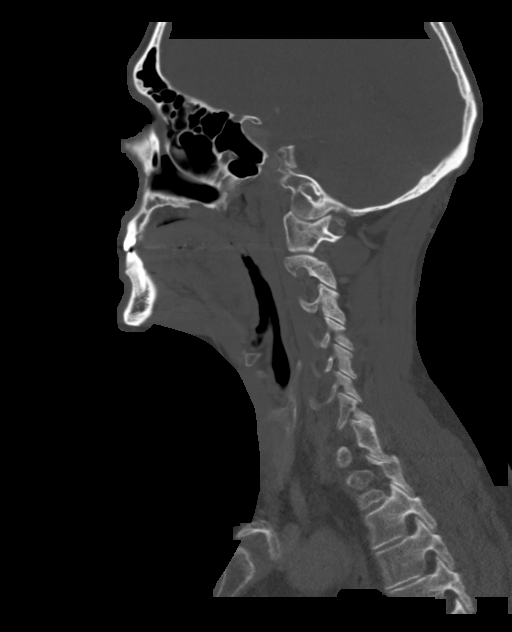
[im 67/101  bone]
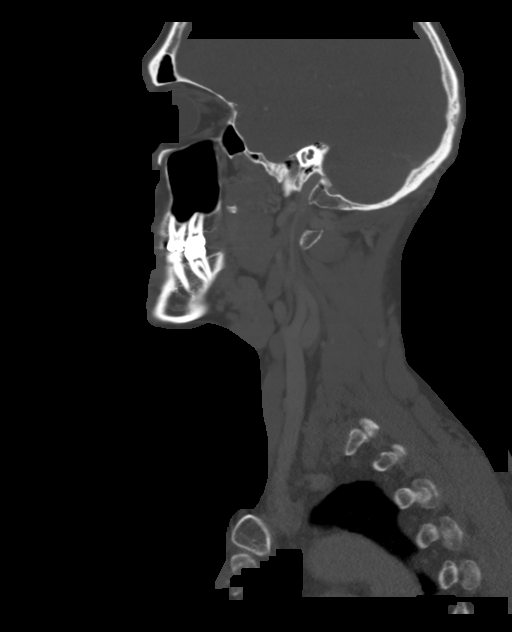

[Series 9: orthogonal ax · axial · 0.39mm/px · z∈[+1007,+1117]mm · 2 of 167 slices shown (1 of 2)]
[im 56/167  bone]
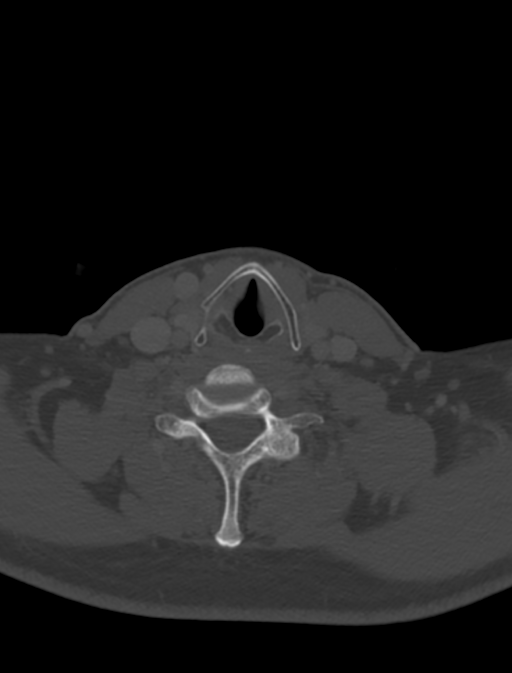
[im 111/167  bone]
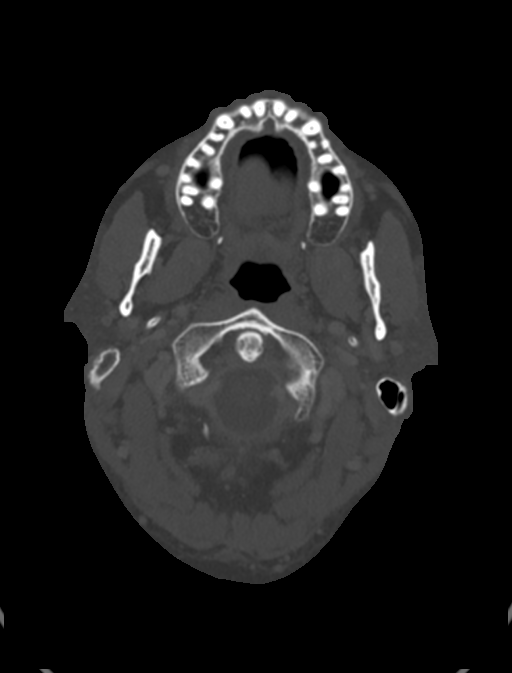

[Series 10: orthogonal ax · axial · 0.39mm/px · z∈[+980,+1098]mm · 2 of 180 slices shown, 3 images (2 of 2)]
[im 60/180  soft-tissue]
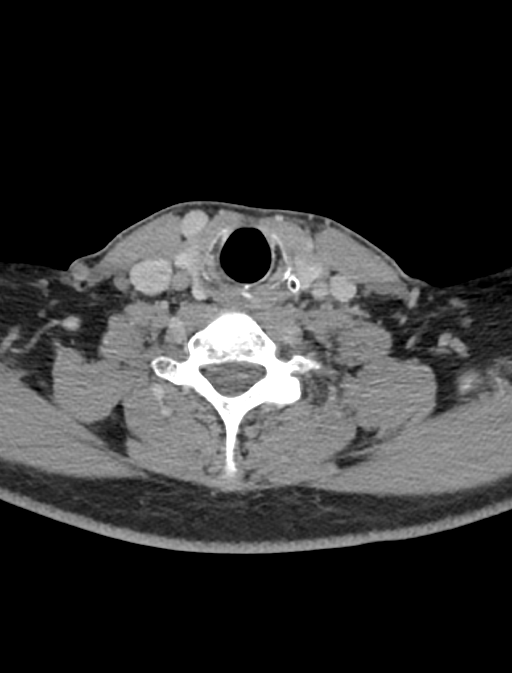
[im 60/180  bone]
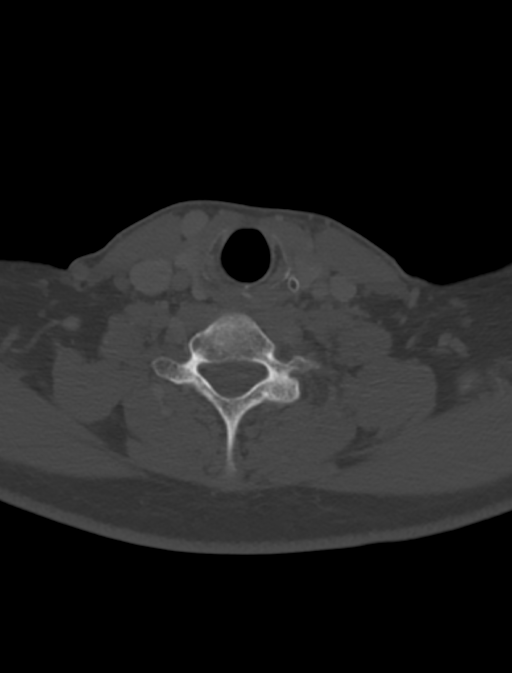
[im 120/180  bone]
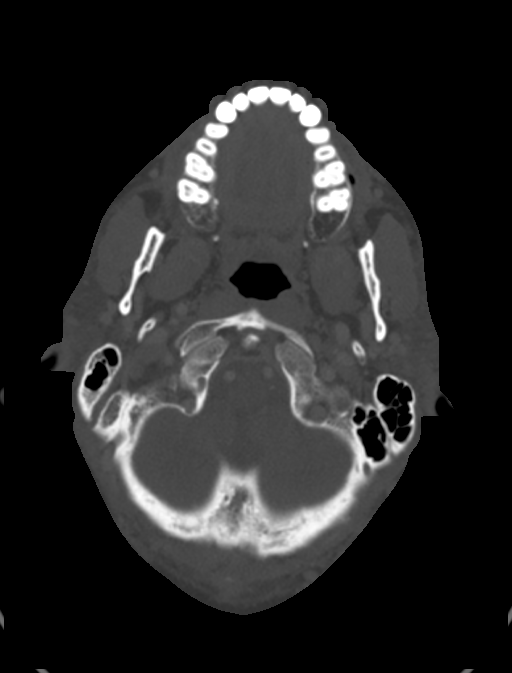

[Series 11: cor neck · coronal · 0.55mm/px · 1 of 108 slices shown]
[im 54/108  bone]
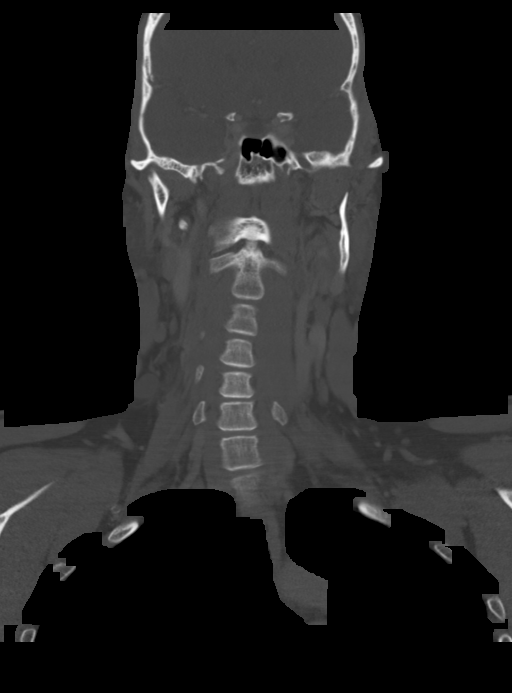

[13 of 33 positions shown; findings below may reference images not displayed]

FINDINGS: Pharynx and larynx:

--Nasopharynx: Fossae of [place] are clear. Normal adenoid
tonsils for age.

--Oral cavity and oropharynx: There is a focal lucency at the
extraction site of tooth two, the right second molar. No
inflammatory changes in the adjacent fat. The U retromolar trigone
is normal. PE visible tongue and floor of mouth are normal.

--Hypopharynx: Normal vallecula and pyriform sinuses.

--Larynx: Normal epiglottis and pre-epiglottic space. Normal
aryepiglottic and vocal folds.

--Retropharyngeal space: No abscess, effusion or lymphadenopathy.

Salivary glands: The parotid, sublingual and submandibular glands
are normal. No sialolithiasis or salivary ductal dilatation.

Thyroid: Normal

Lymph nodes: There is a hypoattenuating structure within the lower
right neck, just deep to the posterior aspect of the
sternocleidomastoid muscle, measuring 1.6 x 0.9 x 1.7 cm. There are
multiple subcentimeter level IIa lymph nodes bilaterally.

Vascular: The carotid and vertebral systems are patent. Both
internal jugular veins are patent.

Limited intracranial: Normal

Visualized orbits: Normal

Mastoids and visualized paranasal sinuses: Clear

Skeleton: Unremarkable

Upper chest: Clear

Other: None
IMPRESSION: 1. Hypoattenuating lesion within the lower right neck, at level 5A.
The appearance of the mass is somewhat indeterminate, but it may
indicate a necrotic lymph node, which would be concerning for
metastatic disease from an occult head and neck carcinoma.
Histologic sampling is recommended, with correlation with endoscopic
evaluation if there are findings of metastatic disease.
2. Focal lucency at the site of prior tooth#2 extraction site
without surrounding inflammatory change.

## 2019-01-22 DIAGNOSIS — D225 Melanocytic nevi of trunk: Secondary | ICD-10-CM | POA: Diagnosis not present

## 2019-04-30 ENCOUNTER — Other Ambulatory Visit: Payer: Self-pay | Admitting: Family Medicine

## 2019-04-30 DIAGNOSIS — Z13220 Encounter for screening for lipoid disorders: Secondary | ICD-10-CM

## 2019-04-30 DIAGNOSIS — Z125 Encounter for screening for malignant neoplasm of prostate: Secondary | ICD-10-CM

## 2019-04-30 DIAGNOSIS — Z8042 Family history of malignant neoplasm of prostate: Secondary | ICD-10-CM

## 2019-04-30 DIAGNOSIS — E663 Overweight: Secondary | ICD-10-CM

## 2019-04-30 NOTE — Addendum Note (Signed)
Addended by: Ria Bush on: 04/30/2019 07:22 AM   Modules accepted: Orders

## 2019-05-02 ENCOUNTER — Other Ambulatory Visit: Payer: Self-pay

## 2019-05-02 ENCOUNTER — Other Ambulatory Visit (INDEPENDENT_AMBULATORY_CARE_PROVIDER_SITE_OTHER): Payer: BC Managed Care – PPO

## 2019-05-02 DIAGNOSIS — E663 Overweight: Secondary | ICD-10-CM | POA: Diagnosis not present

## 2019-05-02 DIAGNOSIS — Z8042 Family history of malignant neoplasm of prostate: Secondary | ICD-10-CM

## 2019-05-02 DIAGNOSIS — Z125 Encounter for screening for malignant neoplasm of prostate: Secondary | ICD-10-CM

## 2019-05-02 DIAGNOSIS — Z13220 Encounter for screening for lipoid disorders: Secondary | ICD-10-CM

## 2019-05-02 LAB — LIPID PANEL
Cholesterol: 180 mg/dL (ref 0–200)
HDL: 48.9 mg/dL (ref >39.00)
LDL Cholesterol: 108 mg/dL — ABNORMAL HIGH (ref 0–99)
NonHDL: 131.54
Total CHOL/HDL Ratio: 4
Triglycerides: 118 mg/dL (ref 0.0–149.0)
VLDL: 23.6 mg/dL (ref 0.0–40.0)

## 2019-05-02 LAB — COMPREHENSIVE METABOLIC PANEL
ALT: 25 U/L (ref 0–53)
AST: 20 U/L (ref 0–37)
Albumin: 4.6 g/dL (ref 3.5–5.2)
Alkaline Phosphatase: 90 U/L (ref 39–117)
BUN: 16 mg/dL (ref 6–23)
CO2: 31 mEq/L (ref 19–32)
Calcium: 9.4 mg/dL (ref 8.4–10.5)
Chloride: 103 mEq/L (ref 96–112)
Creatinine, Ser: 0.94 mg/dL (ref 0.40–1.50)
GFR: 83.71 mL/min (ref >60.00)
Glucose, Bld: 96 mg/dL (ref 70–99)
Potassium: 4.4 mEq/L (ref 3.5–5.1)
Sodium: 140 mEq/L (ref 135–145)
Total Bilirubin: 0.5 mg/dL (ref 0.2–1.2)
Total Protein: 7 g/dL (ref 6.0–8.3)

## 2019-05-03 LAB — PSA: PSA: 1.39 ng/mL (ref 0.10–4.00)

## 2019-05-10 ENCOUNTER — Encounter: Payer: Self-pay | Admitting: Family Medicine

## 2019-05-10 ENCOUNTER — Ambulatory Visit (INDEPENDENT_AMBULATORY_CARE_PROVIDER_SITE_OTHER): Payer: BC Managed Care – PPO | Admitting: Family Medicine

## 2019-05-10 ENCOUNTER — Other Ambulatory Visit: Payer: Self-pay

## 2019-05-10 VITALS — BP 118/76 | HR 72 | Temp 98.2°F | Ht 71.0 in | Wt 209.2 lb

## 2019-05-10 DIAGNOSIS — D229 Melanocytic nevi, unspecified: Secondary | ICD-10-CM

## 2019-05-10 DIAGNOSIS — Z0000 Encounter for general adult medical examination without abnormal findings: Secondary | ICD-10-CM

## 2019-05-10 NOTE — Assessment & Plan Note (Signed)
Preventative protocols reviewed and updated unless pt declined. Discussed healthy diet and lifestyle.  

## 2019-05-10 NOTE — Progress Notes (Signed)
This visit was conducted in person.  BP 118/76 (BP Location: Left Arm, Patient Position: Sitting, Cuff Size: Normal)   Pulse 72   Temp 98.2 F (36.8 C) (Temporal)   Ht 5\' 11"  (1.803 m)   Wt 209 lb 4 oz (94.9 kg)   SpO2 96%   BMI 29.18 kg/m    CC: CP Subjective:    Patient ID: Clarence Moore, male    DOB: 1965-12-21, 53 y.o.   MRN: [id number]  HPI: Clarence Moore is a 53 y.o. male presenting on 05/10/2019 for Annual Exam   States he's investigated nebulized pulmicort as treatment for Covid (endorsed by a [place] doctor).  Preventative: COLONOSCOPY 08/2014; HP, int hem, diverticulosis, rec rpt 5 yrs ([facility])  Prostate cancer - family history (father with prostate issues since age 9s). Request QOY.  Flu shot - declines  Tdap 2016  Seat belt use discussed.  Sunscreen use discussed. No changing moles on skin. Sees derm regularly.  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 Interim medical history since our last visit reviewed. Allergies and medications reviewed and updated. Outpatient Medications Prior to Visit  Medication Sig Dispense Refill  . cetirizine (ZYRTEC) 10 MG tablet Take 10 mg by mouth daily as needed for allergies.     . Multiple Vitamins-Minerals (MULTIVITAMIN ADULT EXTRA C) CHEW Chew 1 tablet by mouth daily.     No facility-administered medications prior to visit.      Per HPI unless specifically indicated in ROS section below Review of Systems  Constitutional: Negative for activity change, appetite change, chills, fatigue, fever and unexpected weight change.  HENT: Negative for hearing loss.   Eyes:  Negative for visual disturbance.  Respiratory: Negative for cough, chest tightness, shortness of breath and wheezing.   Cardiovascular: Negative for chest pain, palpitations and leg swelling.  Gastrointestinal: Negative for abdominal distention, abdominal pain, blood in stool, constipation, diarrhea, nausea and vomiting.  Genitourinary: Negative for difficulty urinating and hematuria.  Musculoskeletal: Negative for arthralgias, myalgias and neck pain.  Skin: Negative for rash.  Neurological: Negative for dizziness, seizures, syncope and headaches.  Hematological: Negative for adenopathy. Does not bruise/bleed easily.  Psychiatric/Behavioral: Negative for dysphoric mood. The patient is not nervous/anxious.    Objective:    BP 118/76 (BP Location: Left Arm, Patient Position: Sitting, Cuff Size: Normal)   Pulse 72   Temp 98.2 F (36.8 C) (Temporal)   Ht 5\' 11"  (1.803 m)   Wt 209 lb 4 oz (94.9 kg)   SpO2 96%   BMI 29.18 kg/m   Wt Readings from Last 3 Encounters:  05/10/19 209 lb 4 oz (94.9 kg)  12/16/16 200 lb (90.7 kg)  12/01/16 207 lb 12.8 oz (94.3 kg)    Physical Exam Vitals signs and nursing note reviewed.  Constitutional:      General: He is not in acute distress.    Appearance: Normal appearance. He is well-developed. He is not ill-appearing.  HENT:     Head: Normocephalic and atraumatic.     Right Ear: Hearing, tympanic membrane, ear canal and external ear normal.     Left Ear:  Hearing, tympanic membrane, ear canal and external ear normal.     Nose: Nose normal.     Mouth/Throat:     Mouth: Mucous membranes are moist.     Pharynx: Oropharynx is clear. Uvula midline. No posterior oropharyngeal erythema.  Eyes:     General: No scleral icterus.    Extraocular Movements: Extraocular movements intact.     Conjunctiva/sclera: Conjunctivae normal.     Pupils: Pupils are equal, round, and reactive to light.  Neck:     Musculoskeletal: Normal range of motion and neck supple.   Cardiovascular:     Rate and Rhythm: Normal rate and regular rhythm.     Pulses: Normal pulses.          Radial pulses are 2+ on the right side and 2+ on the left side.     Heart sounds: Normal heart sounds. No murmur.  Pulmonary:     Effort: Pulmonary effort is normal. No respiratory distress.     Breath sounds: Normal breath sounds. No wheezing, rhonchi or rales.  Abdominal:     General: Abdomen is flat. Bowel sounds are normal. There is no distension.     Palpations: Abdomen is soft. There is no mass.     Tenderness: There is no abdominal tenderness. There is no guarding or rebound.     Hernia: No hernia is present.  Genitourinary:    Prostate: Normal. Not enlarged (15gm), not tender and no nodules present.     Rectum: Normal. No mass, tenderness, anal fissure, external hemorrhoid or internal hemorrhoid. Normal anal tone.  Musculoskeletal: Normal range of motion.     Right lower leg: No edema.     Left lower leg: No edema.  Lymphadenopathy:     Cervical: No cervical adenopathy.  Skin:    General: Skin is warm and dry.     Findings: No rash.     Comments: Moles throughout skin  Neurological:     General: No focal deficit present.     Mental Status: He is alert and oriented to person, place, and time.     Comments: CN grossly intact, station and gait intact  Psychiatric:        Mood and Affect: Mood normal.        Behavior: Behavior normal.        Thought Content: Thought content normal.        Judgment: Judgment normal.       Results for orders placed or performed in visit on 05/02/19  Lipid panel  Result Value Ref Range   Cholesterol 180 0 - 200 mg/dL   Triglycerides 118.0 0.0 - 149.0 mg/dL   HDL 48.90 >39.00 mg/dL   VLDL 23.6 0.0 - 40.0 mg/dL   LDL Cholesterol 108 (H) 0 - 99 mg/dL   Total CHOL/HDL Ratio 4    NonHDL 131.54   PSA  Result Value Ref Range   PSA 1.39 0.10 - 4.00 ng/mL  Comprehensive metabolic panel  Result Value Ref Range   Sodium 140 135 - 145  mEq/L   Potassium 4.4 3.5 - 5.1 mEq/L   Chloride 103 96 - 112 mEq/L   CO2 31 19 - 32 mEq/L   Glucose, Bld 96 70 - 99 mg/dL   BUN 16 6 - 23 mg/dL   Creatinine, Ser 0.94 0.40 - 1.50 mg/dL   Total Bilirubin 0.5 0.2 - 1.2 mg/dL   Alkaline Phosphatase 90 39 - 117 U/L   AST 20 0 - 37 U/L  ALT 25 0 - 53 U/L   Total Protein 7.0 6.0 - 8.3 g/dL   Albumin 4.6 3.5 - 5.2 g/dL   Calcium 9.4 8.4 - 10.5 mg/dL   GFR 83.71 >60.00 mL/min   Assessment & Plan:   Problem List Items Addressed This Visit    Multiple nevi    Planning to schedule f/u with derm.      Healthcare maintenance - Primary    Preventative protocols reviewed and updated unless pt declined. Discussed healthy diet and lifestyle.           No orders of the defined types were placed in this encounter.  No orders of the defined types were placed in this encounter.   Follow up plan: Return in about 1 year (around 05/09/2020) for annual exam, prior fasting for blood work.  Ria Bush, MD

## 2019-05-10 NOTE — Patient Instructions (Addendum)
You are doing well today Return as needed or in 1 year for next physical.  Health Maintenance, Male Adopting a healthy lifestyle and getting preventive care are important in promoting health and wellness. Ask your health care provider about:  The right schedule for you to have regular tests and exams.  Things you can do on your own to prevent diseases and keep yourself healthy. What should I know about diet, weight, and exercise? Eat a healthy diet   Eat a diet that includes plenty of vegetables, fruits, low-fat dairy products, and lean protein.  Do not eat a lot of foods that are high in solid fats, added sugars, or sodium. Maintain a healthy weight Body mass index (BMI) is a measurement that can be used to identify possible weight problems. It estimates body fat based on height and weight. Your health care provider can help determine your BMI and help you achieve or maintain a healthy weight. Get regular exercise Get regular exercise. This is one of the most important things you can do for your health. Most adults should:  Exercise for at least 150 minutes each week. The exercise should increase your heart rate and make you sweat (moderate-intensity exercise).  Do strengthening exercises at least twice a week. This is in addition to the moderate-intensity exercise.  Spend less time sitting. Even light physical activity can be beneficial. Watch cholesterol and blood lipids Have your blood tested for lipids and cholesterol at 53 years of age, then have this test every 5 years. You may need to have your cholesterol levels checked more often if:  Your lipid or cholesterol levels are high.  You are older than 53 years of age.  You are at high risk for heart disease. What should I know about cancer screening? Many types of cancers can be detected early and may often be prevented. Depending on your health history and family history, you may need to have cancer screening at various  ages. This may include screening for:  Colorectal cancer.  Prostate cancer.  Skin cancer.  Lung cancer. What should I know about heart disease, diabetes, and high blood pressure? Blood pressure and heart disease  High blood pressure causes heart disease and increases the risk of stroke. This is more likely to develop in people who have high blood pressure readings, are of African descent, or are overweight.  Talk with your health care provider about your target blood pressure readings.  Have your blood pressure checked: ? Every 3-5 years if you are 53-67 years of age. ? Every year if you are 53 years old or older.  If you are between the ages of 66 and 56 and are a current or former smoker, ask your health care provider if you should have a one-time screening for abdominal aortic aneurysm (AAA). Diabetes Have regular diabetes screenings. This checks your fasting blood sugar level. Have the screening done:  Once every three years after age 53 if you are at a normal weight and have a low risk for diabetes.  More often and at a younger age if you are overweight or have a high risk for diabetes. What should I know about preventing infection? Hepatitis B If you have a higher risk for hepatitis B, you should be screened for this virus. Talk with your health care provider to find out if you are at risk for hepatitis B infection. Hepatitis C Blood testing is recommended for:  Everyone born from [birth year] through 1965.  Anyone with known  risk factors for hepatitis C. Sexually transmitted infections (STIs)  You should be screened each year for STIs, including gonorrhea and chlamydia, if: ? You are sexually active and are younger than 53 years of age. ? You are older than 53 years of age and your health care provider tells you that you are at risk for this type of infection. ? Your sexual activity has changed since you were last screened, and you are at increased risk for chlamydia or  gonorrhea. Ask your health care provider if you are at risk.  Ask your health care provider about whether you are at high risk for HIV. Your health care provider may recommend a prescription medicine to help prevent HIV infection. If you choose to take medicine to prevent HIV, you should first get tested for HIV. You should then be tested every 3 months for as long as you are taking the medicine. Follow these instructions at home: Lifestyle  Do not use any products that contain nicotine or tobacco, such as cigarettes, e-cigarettes, and chewing tobacco. If you need help quitting, ask your health care provider.  Do not use street drugs.  Do not share needles.  Ask your health care provider for help if you need support or information about quitting drugs. Alcohol use  Do not drink alcohol if your health care provider tells you not to drink.  If you drink alcohol: ? Limit how much you have to 0-2 drinks a day. ? Be aware of how much alcohol is in your drink. In the [place] one drink equals one 12 oz bottle of beer (355 mL), one 5 oz glass of wine (148 mL), or one 1 oz glass of hard liquor (44 mL). General instructions  Schedule regular health, dental, and eye exams.  Stay current with your vaccines.  Tell your health care provider if: ? You often feel depressed. ? You have ever been abused or do not feel safe at home. Summary  Adopting a healthy lifestyle and getting preventive care are important in promoting health and wellness.  Follow your health care provider's instructions about healthy diet, exercising, and getting tested or screened for diseases.  Follow your health care provider's instructions on monitoring your cholesterol and blood pressure. This information is not intended to replace advice given to you by your health care provider. Make sure you discuss any questions you have with your health care provider. Document Released: 12/18/2007 Document Revised: 06/14/2018  Document Reviewed: 06/14/2018 Elsevier Patient Education  2020 [employer].

## 2019-05-10 NOTE — Assessment & Plan Note (Signed)
Planning to schedule f/u with derm.

## 2019-09-11 DIAGNOSIS — Z86010 Personal history of colonic polyps: Secondary | ICD-10-CM | POA: Diagnosis not present

## 2019-09-11 DIAGNOSIS — Z1211 Encounter for screening for malignant neoplasm of colon: Secondary | ICD-10-CM | POA: Diagnosis not present

## 2019-10-17 DIAGNOSIS — K648 Other hemorrhoids: Secondary | ICD-10-CM | POA: Diagnosis not present

## 2019-10-17 DIAGNOSIS — Z86010 Personal history of colonic polyps: Secondary | ICD-10-CM | POA: Diagnosis not present

## 2019-10-17 DIAGNOSIS — K5730 Diverticulosis of large intestine without perforation or abscess without bleeding: Secondary | ICD-10-CM | POA: Diagnosis not present

## 2019-10-17 DIAGNOSIS — Z1211 Encounter for screening for malignant neoplasm of colon: Secondary | ICD-10-CM | POA: Diagnosis not present

## 2020-01-23 DIAGNOSIS — L820 Inflamed seborrheic keratosis: Secondary | ICD-10-CM | POA: Diagnosis not present

## 2020-01-23 DIAGNOSIS — L821 Other seborrheic keratosis: Secondary | ICD-10-CM | POA: Diagnosis not present

## 2020-01-23 DIAGNOSIS — C4371 Malignant melanoma of right lower limb, including hip: Secondary | ICD-10-CM | POA: Diagnosis not present

## 2020-01-23 DIAGNOSIS — D485 Neoplasm of uncertain behavior of skin: Secondary | ICD-10-CM | POA: Diagnosis not present

## 2020-01-23 DIAGNOSIS — D225 Melanocytic nevi of trunk: Secondary | ICD-10-CM | POA: Diagnosis not present

## 2020-02-03 ENCOUNTER — Encounter: Payer: Self-pay | Admitting: Family Medicine

## 2020-02-03 DIAGNOSIS — C4371 Malignant melanoma of right lower limb, including hip: Secondary | ICD-10-CM

## 2020-02-03 DIAGNOSIS — Z85820 Personal history of malignant melanoma of skin: Secondary | ICD-10-CM | POA: Insufficient documentation

## 2020-02-03 HISTORY — DX: Malignant melanoma of right lower limb, including hip: C43.71

## 2020-02-06 DIAGNOSIS — C4371 Malignant melanoma of right lower limb, including hip: Secondary | ICD-10-CM | POA: Diagnosis not present

## 2020-02-22 DIAGNOSIS — Z01818 Encounter for other preprocedural examination: Secondary | ICD-10-CM | POA: Diagnosis not present

## 2020-02-22 DIAGNOSIS — Z20822 Contact with and (suspected) exposure to covid-19: Secondary | ICD-10-CM | POA: Diagnosis not present

## 2020-02-22 DIAGNOSIS — Z01812 Encounter for preprocedural laboratory examination: Secondary | ICD-10-CM | POA: Diagnosis not present

## 2020-02-22 DIAGNOSIS — C4371 Malignant melanoma of right lower limb, including hip: Secondary | ICD-10-CM | POA: Diagnosis not present

## 2020-02-26 DIAGNOSIS — Z01812 Encounter for preprocedural laboratory examination: Secondary | ICD-10-CM | POA: Diagnosis not present

## 2020-02-26 DIAGNOSIS — Z20822 Contact with and (suspected) exposure to covid-19: Secondary | ICD-10-CM | POA: Diagnosis not present

## 2020-02-26 DIAGNOSIS — Z01818 Encounter for other preprocedural examination: Secondary | ICD-10-CM | POA: Diagnosis not present

## 2020-02-26 DIAGNOSIS — C4371 Malignant melanoma of right lower limb, including hip: Secondary | ICD-10-CM | POA: Diagnosis not present

## 2020-02-28 DIAGNOSIS — C4371 Malignant melanoma of right lower limb, including hip: Secondary | ICD-10-CM | POA: Diagnosis not present

## 2020-02-28 DIAGNOSIS — Z803 Family history of malignant neoplasm of breast: Secondary | ICD-10-CM | POA: Diagnosis not present

## 2020-02-28 DIAGNOSIS — Z801 Family history of malignant neoplasm of trachea, bronchus and lung: Secondary | ICD-10-CM | POA: Diagnosis not present

## 2020-02-28 DIAGNOSIS — C4361 Malignant melanoma of right upper limb, including shoulder: Secondary | ICD-10-CM | POA: Diagnosis not present

## 2020-02-28 DIAGNOSIS — Z01810 Encounter for preprocedural cardiovascular examination: Secondary | ICD-10-CM | POA: Diagnosis not present

## 2020-06-02 DIAGNOSIS — D485 Neoplasm of uncertain behavior of skin: Secondary | ICD-10-CM | POA: Diagnosis not present

## 2020-06-02 DIAGNOSIS — C4371 Malignant melanoma of right lower limb, including hip: Secondary | ICD-10-CM | POA: Diagnosis not present

## 2020-06-02 DIAGNOSIS — D235 Other benign neoplasm of skin of trunk: Secondary | ICD-10-CM | POA: Diagnosis not present

## 2020-06-02 DIAGNOSIS — D225 Melanocytic nevi of trunk: Secondary | ICD-10-CM | POA: Diagnosis not present

## 2020-07-07 DIAGNOSIS — D235 Other benign neoplasm of skin of trunk: Secondary | ICD-10-CM | POA: Diagnosis not present

## 2020-07-07 DIAGNOSIS — C4371 Malignant melanoma of right lower limb, including hip: Secondary | ICD-10-CM | POA: Diagnosis not present

## 2020-07-07 DIAGNOSIS — L905 Scar conditions and fibrosis of skin: Secondary | ICD-10-CM | POA: Diagnosis not present

## 2020-09-11 DIAGNOSIS — L603 Nail dystrophy: Secondary | ICD-10-CM | POA: Diagnosis not present

## 2020-09-11 DIAGNOSIS — L600 Ingrowing nail: Secondary | ICD-10-CM | POA: Diagnosis not present

## 2020-09-11 DIAGNOSIS — M79675 Pain in left toe(s): Secondary | ICD-10-CM | POA: Diagnosis not present

## 2020-09-23 DIAGNOSIS — D224 Melanocytic nevi of scalp and neck: Secondary | ICD-10-CM | POA: Diagnosis not present

## 2020-09-23 DIAGNOSIS — L600 Ingrowing nail: Secondary | ICD-10-CM | POA: Diagnosis not present

## 2020-09-23 DIAGNOSIS — D485 Neoplasm of uncertain behavior of skin: Secondary | ICD-10-CM | POA: Diagnosis not present

## 2020-09-23 DIAGNOSIS — D235 Other benign neoplasm of skin of trunk: Secondary | ICD-10-CM | POA: Diagnosis not present

## 2020-09-23 DIAGNOSIS — C4371 Malignant melanoma of right lower limb, including hip: Secondary | ICD-10-CM | POA: Diagnosis not present

## 2020-09-23 DIAGNOSIS — D225 Melanocytic nevi of trunk: Secondary | ICD-10-CM | POA: Diagnosis not present

## 2020-11-05 DIAGNOSIS — C4371 Malignant melanoma of right lower limb, including hip: Secondary | ICD-10-CM | POA: Diagnosis not present

## 2021-01-22 DIAGNOSIS — C4441 Basal cell carcinoma of skin of scalp and neck: Secondary | ICD-10-CM | POA: Diagnosis not present

## 2021-01-22 DIAGNOSIS — Z85820 Personal history of malignant melanoma of skin: Secondary | ICD-10-CM | POA: Diagnosis not present

## 2021-01-22 DIAGNOSIS — D2261 Melanocytic nevi of right upper limb, including shoulder: Secondary | ICD-10-CM | POA: Diagnosis not present

## 2021-01-22 DIAGNOSIS — L821 Other seborrheic keratosis: Secondary | ICD-10-CM | POA: Diagnosis not present

## 2021-01-22 DIAGNOSIS — D1801 Hemangioma of skin and subcutaneous tissue: Secondary | ICD-10-CM | POA: Diagnosis not present

## 2021-01-22 DIAGNOSIS — D485 Neoplasm of uncertain behavior of skin: Secondary | ICD-10-CM | POA: Diagnosis not present

## 2021-02-23 DIAGNOSIS — C4441 Basal cell carcinoma of skin of scalp and neck: Secondary | ICD-10-CM | POA: Diagnosis not present

## 2021-02-25 ENCOUNTER — Other Ambulatory Visit: Payer: Self-pay | Admitting: Family Medicine

## 2021-02-25 DIAGNOSIS — Z1159 Encounter for screening for other viral diseases: Secondary | ICD-10-CM

## 2021-02-25 DIAGNOSIS — Z125 Encounter for screening for malignant neoplasm of prostate: Secondary | ICD-10-CM

## 2021-02-25 DIAGNOSIS — E663 Overweight: Secondary | ICD-10-CM

## 2021-02-25 DIAGNOSIS — C4371 Malignant melanoma of right lower limb, including hip: Secondary | ICD-10-CM

## 2021-02-26 ENCOUNTER — Other Ambulatory Visit (INDEPENDENT_AMBULATORY_CARE_PROVIDER_SITE_OTHER): Payer: BLUE CROSS/BLUE SHIELD

## 2021-02-26 ENCOUNTER — Other Ambulatory Visit: Payer: Self-pay

## 2021-02-26 DIAGNOSIS — Z1159 Encounter for screening for other viral diseases: Secondary | ICD-10-CM

## 2021-02-26 DIAGNOSIS — C4371 Malignant melanoma of right lower limb, including hip: Secondary | ICD-10-CM | POA: Diagnosis not present

## 2021-02-26 DIAGNOSIS — E663 Overweight: Secondary | ICD-10-CM

## 2021-02-26 DIAGNOSIS — Z125 Encounter for screening for malignant neoplasm of prostate: Secondary | ICD-10-CM | POA: Diagnosis not present

## 2021-02-26 LAB — CBC WITH DIFFERENTIAL/PLATELET
Basophils Absolute: 0 10*3/uL (ref 0.0–0.1)
Basophils Relative: 0.3 % (ref 0.0–3.0)
Eosinophils Absolute: 0.2 10*3/uL (ref 0.0–0.7)
Eosinophils Relative: 4.1 % (ref 0.0–5.0)
HCT: 41 % (ref 39.0–52.0)
Hemoglobin: 14.2 g/dL (ref 13.0–17.0)
Lymphocytes Relative: 27.6 % (ref 12.0–46.0)
Lymphs Abs: 1.6 10*3/uL (ref 0.7–4.0)
MCHC: 34.7 g/dL (ref 30.0–36.0)
MCV: 89.6 fl (ref 78.0–100.0)
Monocytes Absolute: 0.5 10*3/uL (ref 0.1–1.0)
Monocytes Relative: 8.2 % (ref 3.0–12.0)
Neutro Abs: 3.4 10*3/uL (ref 1.4–7.7)
Neutrophils Relative %: 59.8 % (ref 43.0–77.0)
Platelets: 299 10*3/uL (ref 150.0–400.0)
RBC: 4.58 Mil/uL (ref 4.22–5.81)
RDW: 12.6 % (ref 11.5–15.5)
WBC: 5.7 10*3/uL (ref 4.0–10.5)

## 2021-02-26 LAB — BASIC METABOLIC PANEL
BUN: 17 mg/dL (ref 6–23)
CO2: 28 mEq/L (ref 19–32)
Calcium: 9.6 mg/dL (ref 8.4–10.5)
Chloride: 104 mEq/L (ref 96–112)
Creatinine, Ser: 0.93 mg/dL (ref 0.40–1.50)
GFR: 92.39 mL/min (ref >60.00)
Glucose, Bld: 94 mg/dL (ref 70–99)
Potassium: 4.3 mEq/L (ref 3.5–5.1)
Sodium: 140 mEq/L (ref 135–145)

## 2021-02-26 LAB — LIPID PANEL
Cholesterol: 162 mg/dL (ref 0–200)
HDL: 50.7 mg/dL (ref >39.00)
LDL Cholesterol: 96 mg/dL (ref 0–99)
NonHDL: 111.49
Total CHOL/HDL Ratio: 3
Triglycerides: 79 mg/dL (ref 0.0–149.0)
VLDL: 15.8 mg/dL (ref 0.0–40.0)

## 2021-02-26 LAB — PSA: PSA: 2.16 ng/mL (ref 0.10–4.00)

## 2021-02-27 LAB — HEPATITIS C ANTIBODY
Hepatitis C Ab: NONREACTIVE
SIGNAL TO CUT-OFF: 0.01 (ref <1.00)

## 2021-03-03 ENCOUNTER — Encounter: Payer: Self-pay | Admitting: Family Medicine

## 2021-03-03 ENCOUNTER — Ambulatory Visit (INDEPENDENT_AMBULATORY_CARE_PROVIDER_SITE_OTHER): Payer: BLUE CROSS/BLUE SHIELD | Admitting: Family Medicine

## 2021-03-03 ENCOUNTER — Other Ambulatory Visit: Payer: Self-pay

## 2021-03-03 VITALS — BP 120/80 | HR 65 | Temp 97.7°F | Ht 71.0 in | Wt 207.1 lb

## 2021-03-03 DIAGNOSIS — C4371 Malignant melanoma of right lower limb, including hip: Secondary | ICD-10-CM | POA: Diagnosis not present

## 2021-03-03 DIAGNOSIS — Z0000 Encounter for general adult medical examination without abnormal findings: Secondary | ICD-10-CM | POA: Diagnosis not present

## 2021-03-03 DIAGNOSIS — Z800 Family history of malignant neoplasm of digestive organs: Secondary | ICD-10-CM | POA: Diagnosis not present

## 2021-03-03 NOTE — Assessment & Plan Note (Signed)
Seems overdue for colon cancer screening - I asked him to call GI to ask about colonoscopy f/u plan

## 2021-03-03 NOTE — Patient Instructions (Addendum)
Call GI to ask about when next colonoscopy is due.  Check into shingrix 2 shot shingles series.  Good to see you today.  Return as needed or in 1 year for next physical.   Health Maintenance, Male Adopting a healthy lifestyle and getting preventive care are important in promoting health and wellness. Ask your health care provider about: The right schedule for you to have regular tests and exams. Things you can do on your own to prevent diseases and keep yourself healthy. What should I know about diet, weight, and exercise? Eat a healthy diet  Eat a diet that includes plenty of vegetables, fruits, low-fat dairy products, and lean protein. Do not eat a lot of foods that are high in solid fats, added sugars, or sodium.  Maintain a healthy weight Body mass index (BMI) is a measurement that can be used to identify possible weight problems. It estimates body fat based on height and weight. Your health care provider can help determine your BMI and help you achieve or maintain ahealthy weight. Get regular exercise Get regular exercise. This is one of the most important things you can do for your health. Most adults should: Exercise for at least 150 minutes each week. The exercise should increase your heart rate and make you sweat (moderate-intensity exercise). Do strengthening exercises at least twice a week. This is in addition to the moderate-intensity exercise. Spend less time sitting. Even light physical activity can be beneficial. Watch cholesterol and blood lipids Have your blood tested for lipids and cholesterol at 55 years of age, then havethis test every 5 years. You may need to have your cholesterol levels checked more often if: Your lipid or cholesterol levels are high. You are older than 55 years of age. You are at high risk for heart disease. What should I know about cancer screening? Many types of cancers can be detected early and may often be prevented. Depending on your health  history and family history, you may need to have cancer screening at various ages. This may include screening for: Colorectal cancer. Prostate cancer. Skin cancer. Lung cancer. What should I know about heart disease, diabetes, and high blood pressure? Blood pressure and heart disease High blood pressure causes heart disease and increases the risk of stroke. This is more likely to develop in people who have high blood pressure readings, are of African descent, or are overweight. Talk with your health care provider about your target blood pressure readings. Have your blood pressure checked: Every 3-5 years if you are 50-65 years of age. Every year if you are 31 years old or older. If you are between the ages of 12 and 78 and are a current or former smoker, ask your health care provider if you should have a one-time screening for abdominal aortic aneurysm (AAA). Diabetes Have regular diabetes screenings. This checks your fasting blood sugar level. Have the screening done: Once every three years after age 59 if you are at a normal weight and have a low risk for diabetes. More often and at a younger age if you are overweight or have a high risk for diabetes. What should I know about preventing infection? Hepatitis B If you have a higher risk for hepatitis B, you should be screened for this virus. Talk with your health care provider to find out if you are at risk forhepatitis B infection. Hepatitis C Blood testing is recommended for: Everyone born from [birth year] through 1965. Anyone with known risk factors for hepatitis C.  Sexually transmitted infections (STIs) You should be screened each year for STIs, including gonorrhea and chlamydia, if: You are sexually active and are younger than 55 years of age. You are older than 55 years of age and your health care provider tells you that you are at risk for this type of infection. Your sexual activity has changed since you were last screened, and you are  at increased risk for chlamydia or gonorrhea. Ask your health care provider if you are at risk. Ask your health care provider about whether you are at high risk for HIV. Your health care provider may recommend a prescription medicine to help prevent HIV infection. If you choose to take medicine to prevent HIV, you should first get tested for HIV. You should then be tested every 3 months for as long as you are taking the medicine. Follow these instructions at home: Lifestyle Do not use any products that contain nicotine or tobacco, such as cigarettes, e-cigarettes, and chewing tobacco. If you need help quitting, ask your health care provider. Do not use street drugs. Do not share needles. Ask your health care provider for help if you need support or information about quitting drugs. Alcohol use Do not drink alcohol if your health care provider tells you not to drink. If you drink alcohol: Limit how much you have to 0-2 drinks a day. Be aware of how much alcohol is in your drink. In the [place] one drink equals one 12 oz bottle of beer (355 mL), one 5 oz glass of wine (148 mL), or one 1 oz glass of hard liquor (44 mL). General instructions Schedule regular health, dental, and eye exams. Stay current with your vaccines. Tell your health care provider if: You often feel depressed. You have ever been abused or do not feel safe at home. Summary Adopting a healthy lifestyle and getting preventive care are important in promoting health and wellness. Follow your health care provider's instructions about healthy diet, exercising, and getting tested or screened for diseases. Follow your health care provider's instructions on monitoring your cholesterol and blood pressure. This information is not intended to replace advice given to you by your health care provider. Make sure you discuss any questions you have with your healthcare provider. Document Revised: 06/14/2018 Document Reviewed:  06/14/2018 Elsevier Patient Education  2022 [employer].

## 2021-03-03 NOTE — Assessment & Plan Note (Signed)
Preventative protocols reviewed and updated unless pt declined. Discussed healthy diet and lifestyle.  

## 2021-03-03 NOTE — Progress Notes (Signed)
Patient ID: Clarence Moore, male    DOB: [birth year], 55 y.o.   MRN: [id number]  This visit was conducted in person.  BP 120/80   Pulse 65   Temp 97.7 F (36.5 C) (Temporal)   Ht '5\' 11"'$  (1.803 m)   Wt 207 lb 1 oz (93.9 kg)   SpO2 98%   BMI 28.88 kg/m    CC: CPE Subjective:   HPI: Clarence Moore is a 55 y.o. male presenting on 03/03/2021 for Annual Exam   Since last seen, found melanoma to R calf s/p excision. Now sees derm regularly. Recent basal cell removed from scalp, s/p Moh's surgery last week.   Preventative: COLONOSCOPY 08/2014; HP, int hem, diverticulosis, rec rpt 5 yrs ([facility] @ [place])  Prostate cancer - family history (grandfather) Lung cancer screening - not eligible  Flu shot - declines  COVID vaccine - declines Tdap 2016  Shingrix - discussed, declines - he will look into this vaccine Seat belt use discussed  Sunscreen use discussed. Denies new changing moles on skin. Sees derm regularly. See above for hx melanoma.  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 Interim medical history since our last visit reviewed. Allergies and medications reviewed and updated. Outpatient Medications Prior to Visit  Medication Sig Dispense Refill   cetirizine (ZYRTEC) 10 MG tablet Take 10 mg by mouth daily as needed for allergies.      Multiple Vitamins-Minerals (MULTIVITAMIN ADULT EXTRA C) CHEW Chew 1 tablet by mouth daily.     No facility-administered medications prior to visit.     Per HPI unless specifically indicated in ROS section below Review of Systems  Constitutional:   Negative for activity change, appetite change, chills, fatigue, fever and unexpected weight change.  HENT:  Negative for hearing loss.   Eyes:  Negative for visual disturbance.  Respiratory:  Negative for cough, chest tightness, shortness of breath and wheezing.   Cardiovascular:  Negative for chest pain, palpitations and leg swelling.  Gastrointestinal:  Negative for abdominal distention, abdominal pain, blood in stool, constipation, diarrhea, nausea and vomiting.  Genitourinary:  Negative for difficulty urinating and hematuria.  Musculoskeletal:  Negative for arthralgias, myalgias and neck pain.  Skin:  Negative for rash.  Neurological:  Negative for dizziness, seizures, syncope and headaches.  Hematological:  Negative for adenopathy. Does not bruise/bleed easily.  Psychiatric/Behavioral:  Negative for dysphoric mood. The patient is not nervous/anxious.    Objective:  BP 120/80   Pulse 65   Temp 97.7 F (36.5 C) (Temporal)   Ht '5\' 11"'$  (1.803 m)   Wt 207 lb 1 oz (93.9 kg)   SpO2 98%   BMI 28.88 kg/m   Wt Readings from Last 3 Encounters:  03/03/21 207 lb 1 oz (93.9 kg)  05/10/19 209 lb 4 oz (94.9 kg)  12/16/16 200 lb (90.7 kg)      Physical Exam Vitals and nursing note reviewed.  Constitutional:      General: He is not in acute distress.    Appearance: Normal appearance. He is well-developed. He is not ill-appearing.  HENT:  Head: Normocephalic and atraumatic.     Right Ear: Hearing, tympanic membrane, ear canal and external ear normal.     Left Ear: Hearing, tympanic membrane, ear canal and external ear normal.  Eyes:     General: No scleral icterus.    Extraocular Movements: Extraocular movements intact.     Conjunctiva/sclera: Conjunctivae normal.     Pupils: Pupils are equal, round, and reactive to light.  Neck:     Thyroid: No thyroid mass or thyromegaly.  Cardiovascular:     Rate and Rhythm: Normal rate and regular rhythm.     Pulses: Normal pulses.           Radial pulses are 2+ on the right side and 2+ on the left side.     Heart sounds: Normal heart sounds. No murmur heard. Pulmonary:     Effort: Pulmonary effort is normal. No respiratory distress.     Breath sounds: Normal breath sounds. No wheezing, rhonchi or rales.  Abdominal:     General: Bowel sounds are normal. There is no distension.     Palpations: Abdomen is soft. There is no mass.     Tenderness: There is no abdominal tenderness. There is no guarding or rebound.     Hernia: No hernia is present.  Musculoskeletal:        General: Normal range of motion.     Cervical back: Normal range of motion and neck supple.     Right lower leg: No edema.     Left lower leg: No edema.  Lymphadenopathy:     Cervical: No cervical adenopathy.  Skin:    General: Skin is warm and dry.     Findings: No rash.  Neurological:     General: No focal deficit present.     Mental Status: He is alert and oriented to person, place, and time.  Psychiatric:        Mood and Affect: Mood normal.        Behavior: Behavior normal.        Thought Content: Thought content normal.        Judgment: Judgment normal.      Results for orders placed or performed in visit on 02/26/21  CBC with Differential/Platelet  Result Value Ref Range   WBC 5.7 4.0 - 10.5 K/uL   RBC 4.58 4.22 - 5.81 Mil/uL   Hemoglobin 14.2 13.0 - 17.0 g/dL   HCT 41.0 39.0 - 52.0 %   MCV 89.6 78.0 - 100.0 fl   MCHC 34.7 30.0 - 36.0 g/dL   RDW 12.6 11.5 - 15.5 %   Platelets 299.0 150.0 - 400.0 K/uL   Neutrophils Relative % 59.8 43.0 - 77.0 %   Lymphocytes Relative 27.6 12.0 - 46.0 %   Monocytes Relative 8.2 3.0 - 12.0 %   Eosinophils Relative 4.1 0.0 - 5.0 %   Basophils Relative 0.3 0.0 - 3.0 %   Neutro Abs 3.4 1.4 - 7.7 K/uL   Lymphs Abs 1.6 0.7 - 4.0 K/uL   Monocytes Absolute 0.5 0.1 - 1.0 K/uL   Eosinophils Absolute 0.2 0.0 - 0.7 K/uL   Basophils Absolute 0.0 0.0 - 0.1 K/uL  Hepatitis C antibody  Result Value Ref Range    Hepatitis C Ab NON-REACTIVE NON-REACTIVE   SIGNAL TO CUT-OFF 0.01 <1.00  PSA  Result Value Ref Range   PSA 2.16 0.10 - 4.00 ng/mL  Lipid panel  Result Value Ref Range   Cholesterol 162 0 - 200 mg/dL  Triglycerides 79.0 0.0 - 149.0 mg/dL   HDL 50.70 >39.00 mg/dL   VLDL 15.8 0.0 - 40.0 mg/dL   LDL Cholesterol 96 0 - 99 mg/dL   Total CHOL/HDL Ratio 3    NonHDL 0000000   Basic metabolic panel  Result Value Ref Range   Sodium 140 135 - 145 mEq/L   Potassium 4.3 3.5 - 5.1 mEq/L   Chloride 104 96 - 112 mEq/L   CO2 28 19 - 32 mEq/L   Glucose, Bld 94 70 - 99 mg/dL   BUN 17 6 - 23 mg/dL   Creatinine, Ser 0.93 0.40 - 1.50 mg/dL   GFR 92.39 >60.00 mL/min   Calcium 9.6 8.4 - 10.5 mg/dL    Assessment & Plan:  This visit occurred during the SARS-CoV-2 public health emergency.  Safety protocols were in place, including screening questions prior to the visit, additional usage of staff PPE, and extensive cleaning of exam room while observing appropriate contact time as indicated for disinfecting solutions.   Problem List Items Addressed This Visit     Family history of colon cancer    Seems overdue for colon cancer screening - I asked him to call GI to ask about colonoscopy f/u plan       Healthcare maintenance - Primary    Preventative protocols reviewed and updated unless pt declined. Discussed healthy diet and lifestyle.       Malignant melanoma of lower leg, right ([place])    Regularly followed by derm and derm surgery.         No orders of the defined types were placed in this encounter.  No orders of the defined types were placed in this encounter.   Patient instructions: Call GI to ask about when next colonoscopy is due.  Check into shingrix 2 shot shingles series.  Good to see you today.  Return as needed or in 1 year for next physical.   Follow up plan: Return in about 1 year (around 03/03/2022), or if symptoms worsen or fail to improve, for annual exam, prior fasting  for blood work.  Ria Bush, MD

## 2021-03-03 NOTE — Assessment & Plan Note (Signed)
Regularly followed by derm and derm surgery.

## 2021-03-11 DIAGNOSIS — C4371 Malignant melanoma of right lower limb, including hip: Secondary | ICD-10-CM | POA: Diagnosis not present

## 2021-07-13 DIAGNOSIS — C4371 Malignant melanoma of right lower limb, including hip: Secondary | ICD-10-CM | POA: Diagnosis not present

## 2021-07-28 DIAGNOSIS — L820 Inflamed seborrheic keratosis: Secondary | ICD-10-CM | POA: Diagnosis not present

## 2021-07-28 DIAGNOSIS — Z85828 Personal history of other malignant neoplasm of skin: Secondary | ICD-10-CM | POA: Diagnosis not present

## 2021-07-28 DIAGNOSIS — D487 Neoplasm of uncertain behavior of other specified sites: Secondary | ICD-10-CM | POA: Diagnosis not present

## 2021-07-28 DIAGNOSIS — D225 Melanocytic nevi of trunk: Secondary | ICD-10-CM | POA: Diagnosis not present

## 2021-07-28 DIAGNOSIS — D235 Other benign neoplasm of skin of trunk: Secondary | ICD-10-CM | POA: Diagnosis not present

## 2021-07-28 DIAGNOSIS — L738 Other specified follicular disorders: Secondary | ICD-10-CM | POA: Diagnosis not present

## 2021-07-28 DIAGNOSIS — Z85820 Personal history of malignant melanoma of skin: Secondary | ICD-10-CM | POA: Diagnosis not present

## 2021-08-20 DIAGNOSIS — D235 Other benign neoplasm of skin of trunk: Secondary | ICD-10-CM | POA: Diagnosis not present

## 2021-09-03 DIAGNOSIS — L905 Scar conditions and fibrosis of skin: Secondary | ICD-10-CM | POA: Diagnosis not present

## 2021-11-09 DIAGNOSIS — C4371 Malignant melanoma of right lower limb, including hip: Secondary | ICD-10-CM | POA: Diagnosis not present

## 2021-11-26 DIAGNOSIS — D485 Neoplasm of uncertain behavior of skin: Secondary | ICD-10-CM | POA: Diagnosis not present

## 2021-11-26 DIAGNOSIS — D235 Other benign neoplasm of skin of trunk: Secondary | ICD-10-CM | POA: Diagnosis not present

## 2021-11-26 DIAGNOSIS — D0359 Melanoma in situ of other part of trunk: Secondary | ICD-10-CM | POA: Diagnosis not present

## 2021-11-26 DIAGNOSIS — Z85828 Personal history of other malignant neoplasm of skin: Secondary | ICD-10-CM | POA: Diagnosis not present

## 2021-11-26 DIAGNOSIS — D225 Melanocytic nevi of trunk: Secondary | ICD-10-CM | POA: Diagnosis not present

## 2021-11-26 DIAGNOSIS — Z85820 Personal history of malignant melanoma of skin: Secondary | ICD-10-CM | POA: Diagnosis not present

## 2021-12-24 DIAGNOSIS — D0359 Melanoma in situ of other part of trunk: Secondary | ICD-10-CM | POA: Diagnosis not present

## 2021-12-24 DIAGNOSIS — L237 Allergic contact dermatitis due to plants, except food: Secondary | ICD-10-CM | POA: Diagnosis not present

## 2022-03-11 DIAGNOSIS — N281 Cyst of kidney, acquired: Secondary | ICD-10-CM | POA: Diagnosis not present

## 2022-03-11 DIAGNOSIS — K760 Fatty (change of) liver, not elsewhere classified: Secondary | ICD-10-CM | POA: Diagnosis not present

## 2022-03-11 DIAGNOSIS — R1013 Epigastric pain: Secondary | ICD-10-CM | POA: Diagnosis not present

## 2022-03-11 DIAGNOSIS — I10 Essential (primary) hypertension: Secondary | ICD-10-CM | POA: Diagnosis not present

## 2022-03-11 DIAGNOSIS — K5730 Diverticulosis of large intestine without perforation or abscess without bleeding: Secondary | ICD-10-CM | POA: Diagnosis not present

## 2022-03-11 DIAGNOSIS — K859 Acute pancreatitis without necrosis or infection, unspecified: Secondary | ICD-10-CM | POA: Diagnosis not present

## 2022-03-11 DIAGNOSIS — K7689 Other specified diseases of liver: Secondary | ICD-10-CM | POA: Diagnosis not present

## 2022-03-11 DIAGNOSIS — K8500 Idiopathic acute pancreatitis without necrosis or infection: Secondary | ICD-10-CM | POA: Diagnosis not present

## 2022-03-11 DIAGNOSIS — K8689 Other specified diseases of pancreas: Secondary | ICD-10-CM | POA: Diagnosis not present

## 2022-03-19 ENCOUNTER — Telehealth: Payer: Self-pay | Admitting: Family Medicine

## 2022-03-19 NOTE — Telephone Encounter (Signed)
Patient called and stated can he get his labs drawn on 03/22/2022 the same day he has his hospital follow up.

## 2022-03-19 NOTE — Telephone Encounter (Signed)
Ok to do this. Thank you.  

## 2022-03-22 ENCOUNTER — Ambulatory Visit: Payer: BLUE CROSS/BLUE SHIELD | Admitting: Family Medicine

## 2022-03-22 ENCOUNTER — Encounter: Payer: Self-pay | Admitting: Family Medicine

## 2022-03-22 VITALS — BP 118/82 | HR 74 | Temp 97.5°F | Ht 71.0 in | Wt 205.2 lb

## 2022-03-22 DIAGNOSIS — Z125 Encounter for screening for malignant neoplasm of prostate: Secondary | ICD-10-CM

## 2022-03-22 DIAGNOSIS — Z8719 Personal history of other diseases of the digestive system: Secondary | ICD-10-CM | POA: Insufficient documentation

## 2022-03-22 DIAGNOSIS — K859 Acute pancreatitis without necrosis or infection, unspecified: Secondary | ICD-10-CM

## 2022-03-22 HISTORY — DX: Acute pancreatitis without necrosis or infection, unspecified: K85.90

## 2022-03-22 LAB — CBC WITH DIFFERENTIAL/PLATELET
Basophils Absolute: 0 10*3/uL (ref 0.0–0.1)
Basophils Relative: 0.4 % (ref 0.0–3.0)
Eosinophils Absolute: 0.1 10*3/uL (ref 0.0–0.7)
Eosinophils Relative: 1 % (ref 0.0–5.0)
HCT: 41.2 % (ref 39.0–52.0)
Hemoglobin: 14.1 g/dL (ref 13.0–17.0)
Lymphocytes Relative: 21.6 % (ref 12.0–46.0)
Lymphs Abs: 1.8 10*3/uL (ref 0.7–4.0)
MCHC: 34.2 g/dL (ref 30.0–36.0)
MCV: 90.2 fl (ref 78.0–100.0)
Monocytes Absolute: 0.6 10*3/uL (ref 0.1–1.0)
Monocytes Relative: 7.3 % (ref 3.0–12.0)
Neutro Abs: 5.8 10*3/uL (ref 1.4–7.7)
Neutrophils Relative %: 69.7 % (ref 43.0–77.0)
Platelets: 424 10*3/uL — ABNORMAL HIGH (ref 150.0–400.0)
RBC: 4.57 Mil/uL (ref 4.22–5.81)
RDW: 12.2 % (ref 11.5–15.5)
WBC: 8.3 10*3/uL (ref 4.0–10.5)

## 2022-03-22 LAB — COMPREHENSIVE METABOLIC PANEL
ALT: 23 U/L (ref 0–53)
AST: 15 U/L (ref 0–37)
Albumin: 4.4 g/dL (ref 3.5–5.2)
Alkaline Phosphatase: 103 U/L (ref 39–117)
BUN: 17 mg/dL (ref 6–23)
CO2: 26 mEq/L (ref 19–32)
Calcium: 9.7 mg/dL (ref 8.4–10.5)
Chloride: 103 mEq/L (ref 96–112)
Creatinine, Ser: 0.92 mg/dL (ref 0.40–1.50)
GFR: 92.9 mL/min (ref >60.00)
Glucose, Bld: 81 mg/dL (ref 70–99)
Potassium: 4.8 mEq/L (ref 3.5–5.1)
Sodium: 139 mEq/L (ref 135–145)
Total Bilirubin: 0.8 mg/dL (ref 0.2–1.2)
Total Protein: 7.5 g/dL (ref 6.0–8.3)

## 2022-03-22 LAB — LIPID PANEL
Cholesterol: 152 mg/dL (ref 0–200)
HDL: 47.1 mg/dL (ref >39.00)
LDL Cholesterol: 89 mg/dL (ref 0–99)
NonHDL: 105.32
Total CHOL/HDL Ratio: 3
Triglycerides: 80 mg/dL (ref 0.0–149.0)
VLDL: 16 mg/dL (ref 0.0–40.0)

## 2022-03-22 LAB — LIPASE: Lipase: 830 U/L — ABNORMAL HIGH (ref 11.0–59.0)

## 2022-03-22 LAB — PSA: PSA: 2.48 ng/mL (ref 0.10–4.00)

## 2022-03-22 NOTE — Progress Notes (Signed)
Patient ID: Clarence Moore, male    DOB: Nov 23, 1965, 56 y.o.   MRN: [id number]  This visit was conducted in person.  BP 118/82   Pulse 74   Temp (!) 97.5 F (36.4 C) (Temporal)   Ht '5\' 11"'$  (1.803 m)   Wt 205 lb 4 oz (93.1 kg)   SpO2 96%   BMI 28.63 kg/m    CC: hosp f/u visit  Subjective:   HPI: Clarence Moore is a 56 y.o. male presenting on 03/22/2022 for Hospitalization Follow-up (Admitted on 03/11/22 at [facility], dx idiopathic acute pancreatitis without infection/necrosis. )   Recent hospitalization at [facility] for pancreatitis. Presented with abdominal pain, nausea and vomiting, lipase elevated to 2910 and CT scan showed moderate acute pancreatitis. Had just eaten chili. Treated with NPO, IV fluids, antiemetics and pain medications. Symptoms improved on their own. Diet advanced, discharged home. Lipase down to 51 on discharge.  Hospital records reviewed. Med rec performed.  Trig 168 Cr 1.04 WBC 9.1 Hgb 13.9 LFTs normal UA normal  ?gallstone due to sudden onset. CT didn't show gallstone or other stones. No alcohol.   Over weekend had recurrent symptoms of epigastric pain radiation to back. Had chicken cooked with butter prior. Treated with NPO just water yesterday with improvement.   No fevers/chills, nausea, vomiting, diarrhea/constipation.   Home health not set up.  Other follow up appointments scheduled: none ______________________________________________________________________ Hospital admission: 03/11/2022 Hospital discharge: 03/12/2022 TCM f/u phone call: not performed      Relevant past medical, surgical, family and social history reviewed and updated as indicated. Interim medical history since our last visit reviewed. Allergies and medications reviewed and updated. Outpatient Medications Prior to Visit  Medication Sig Dispense Refill  . cetirizine (ZYRTEC) 10 MG tablet Take 10 mg by mouth daily as needed for allergies.     . Multiple Vitamins-Minerals  (MULTIVITAMIN ADULT EXTRA C) CHEW Chew 1 tablet by mouth daily.     No facility-administered medications prior to visit.     Per HPI unless specifically indicated in ROS section below Review of Systems  Objective:  BP 118/82   Pulse 74   Temp (!) 97.5 F (36.4 C) (Temporal)   Ht '5\' 11"'$  (1.803 m)   Wt 205 lb 4 oz (93.1 kg)   SpO2 96%   BMI 28.63 kg/m   Wt Readings from Last 3 Encounters:  03/22/22 205 lb 4 oz (93.1 kg)  03/03/21 207 lb 1 oz (93.9 kg)  05/10/19 209 lb 4 oz (94.9 kg)      Physical Exam Vitals and nursing note reviewed.  Constitutional:      Appearance: Normal appearance. He is not ill-appearing.  HENT:     Mouth/Throat:     Mouth: Mucous membranes are moist.     Pharynx: Oropharynx is clear. No oropharyngeal exudate or posterior oropharyngeal erythema.  Eyes:     Extraocular Movements: Extraocular movements intact.     Conjunctiva/sclera: Conjunctivae normal.     Pupils: Pupils are equal, round, and reactive to light.  Cardiovascular:     Rate and Rhythm: Normal rate and regular rhythm.     Pulses: Normal pulses.     Heart sounds: Normal heart sounds. No murmur heard. Pulmonary:     Effort: Pulmonary effort is normal. No respiratory distress.     Breath sounds: Normal breath sounds. No wheezing, rhonchi or rales.  Abdominal:     General: Abdomen is flat. Bowel sounds are normal. There is  no distension.     Palpations: Abdomen is soft. There is no mass.     Tenderness: There is abdominal tenderness (mild) in the epigastric area and left upper quadrant. There is no guarding or rebound. Negative signs include Murphy's sign.     Hernia: No hernia is present.  Musculoskeletal:     Right lower leg: No edema.     Left lower leg: No edema.  Skin:    General: Skin is warm and dry.     Findings: No rash.  Neurological:     Mental Status: He is alert.  Psychiatric:        Mood and Affect: Mood normal.        Behavior: Behavior normal.      Results for  orders placed or performed in visit on 02/26/21  CBC with Differential/Platelet  Result Value Ref Range   WBC 5.7 4.0 - 10.5 K/uL   RBC 4.58 4.22 - 5.81 Mil/uL   Hemoglobin 14.2 13.0 - 17.0 g/dL   HCT 41.0 39.0 - 52.0 %   MCV 89.6 78.0 - 100.0 fl   MCHC 34.7 30.0 - 36.0 g/dL   RDW 12.6 11.5 - 15.5 %   Platelets 299.0 150.0 - 400.0 K/uL   Neutrophils Relative % 59.8 43.0 - 77.0 %   Lymphocytes Relative 27.6 12.0 - 46.0 %   Monocytes Relative 8.2 3.0 - 12.0 %   Eosinophils Relative 4.1 0.0 - 5.0 %   Basophils Relative 0.3 0.0 - 3.0 %   Neutro Abs 3.4 1.4 - 7.7 K/uL   Lymphs Abs 1.6 0.7 - 4.0 K/uL   Monocytes Absolute 0.5 0.1 - 1.0 K/uL   Eosinophils Absolute 0.2 0.0 - 0.7 K/uL   Basophils Absolute 0.0 0.0 - 0.1 K/uL  Hepatitis C antibody  Result Value Ref Range   Hepatitis C Ab NON-REACTIVE NON-REACTIVE   SIGNAL TO CUT-OFF 0.01 <1.00  PSA  Result Value Ref Range   PSA 2.16 0.10 - 4.00 ng/mL  Lipid panel  Result Value Ref Range   Cholesterol 162 0 - 200 mg/dL   Triglycerides 79.0 0.0 - 149.0 mg/dL   HDL 50.70 >39.00 mg/dL   VLDL 15.8 0.0 - 40.0 mg/dL   LDL Cholesterol 96 0 - 99 mg/dL   Total CHOL/HDL Ratio 3    NonHDL 867.61   Basic metabolic panel  Result Value Ref Range   Sodium 140 135 - 145 mEq/L   Potassium 4.3 3.5 - 5.1 mEq/L   Chloride 104 96 - 112 mEq/L   CO2 28 19 - 32 mEq/L   Glucose, Bld 94 70 - 99 mg/dL   BUN 17 6 - 23 mg/dL   Creatinine, Ser 0.93 0.40 - 1.50 mg/dL   GFR 92.39 >60.00 mL/min   Calcium 9.6 8.4 - 10.5 mg/dL   CT scan: INDICATION: Abdominal pain, acute, nonlocalized TECHNIQUE: CT ABDOMEN PELVIS W IV CONTRAST 80 mL IOPAMIDOL 76 % IV SOLN Dose reduction was utilized (automated exposure control, mA or kV adjustment based on patient size, or iterative image reconstruction). FINDINGS: CT ABDOMEN PELVIS Lower Chest: Unremarkable Liver: Small liver cysts. Gallbladder and Biliary Tree: Unremarkable gallbladder. No biliary dilatation identified. Pancreas:  Prominent pancreatic and peripancreatic edema, predominantly involving the pancreatic tail, with mild peripancreatic fluid which extends into the retroperitoneum. Negative for pancreatic ductal dilatation, pseudocyst, or mass. Spleen: Unremarkable Adrenal Glands: Unremarkable Kidneys and Urinary Bladder: Small left renal cysts. Unremarkable kidneys. Mildly distended urinary bladder. Reproductive: Mild prostatomegaly. Vasculature: Aortoiliac atherosclerosis  without aneurysm. IVC flattening. Lymph Nodes: Unremarkable Peritoneum: Mild intraperitoneal fluid. No free air. No abscess. Gastrointestinal Tract: Small hiatal hernia. Mild to moderate diffuse gastric wall thickening. Multiple mildly distended fluid-filled loops of small bowel with air-fluid levels. No bowel obstruction. No evidence for acute appendicitis. Colonic diverticulosis. Abdominal and Pelvic Walls: Small fat-containing bilateral inguinal hernias. Bones: No acute or destructive osseous process.  IMPRESSION: CT ABDOMEN PELVIS 1. Moderate acute pancreatitis. Consider correlation with right upper quadrant ultrasound. 2. Suspected gastroenteritis or mild bowel ileus. 3. Mild intraperitoneal fluid. 4. Mild to moderate diffuse gastric wall thickening likely reactive to acute pancreatitis process. 5. Colonic diverticulosis. 6. IVC flattening suggesting intravascular volume depletion. Electronically Signed by: Zettie Pho, MD on 03/11/2022 3:56 AM  RUQ US IMPRESSION: 1.  Hepatic steatosis. 2.  No biliary ductal dilation or cholelithiasis. Electronically Signed by: Sol Passer, MD on 03/11/2022 3:34 PM    Assessment & Plan:   Problem List Items Addressed This Visit     Acute pancreatitis without infection or necrosis - Primary    Reviewed hospital records with patient.  No obvious etiology found - imaging without gallbladder or biliary tree stone, no alcohol use, triglycerides only mildly elevated.  Discussed dietary measures to prevent  recurrence, handout provided. Given recurrent symptoms yesterday, check lipase.  Update if recurrent symptoms for GI evaluation. Pt agrees with plan.       Relevant Orders   Comprehensive metabolic panel   Lipid panel   CBC with Differential/Platelet   Lipase   Other Visit Diagnoses     Special screening for malignant neoplasm of prostate       Relevant Orders   PSA        No orders of the defined types were placed in this encounter.  Orders Placed This Encounter  Procedures  . Comprehensive metabolic panel  . Lipid panel  . CBC with Differential/Platelet  . PSA  . Lipase    Patient intsructions: Good to see you today.  Check labs today.  Keep me updated with symptoms.   Follow up plan: Return if symptoms worsen or fail to improve.  Ria Bush, MD

## 2022-03-22 NOTE — Telephone Encounter (Signed)
Spoke with pt relaying Dr. Synthia Innocent message.  Pt states he already ate.  But he will schedule fasting lab visit after today's OV.

## 2022-03-22 NOTE — Patient Instructions (Signed)
Good to see you today.  Check labs today.  Keep me updated with symptoms.   Pancreatitis Eating Plan Pancreatitis is when your pancreas gets irritated and swollen (inflamed). The pancreas is a small gland behind your stomach. It helps your body digest food and regulate your blood sugar. Pancreatitis can affect how your body digests food, especially foods with fat. You may also have other symptoms such as pain in your abdomen (abdominal pain) or nausea. When you have pancreatitis, following a low-fat eating plan may help you manage symptoms and recover faster. Work with your health care provider or a dietitian to create an eating plan that is right for you. What are tips for following this plan? Reading food labels Use the information on food labels to help keep track of how much fat you eat: Check the serving size. Look for the amount of total fat in grams (g) in one serving. Low-fat foods have 3 g of fat or less per serving. Fat-free foods have 0.5 g of fat or less per serving. Keep track of how much fat you eat based on how many servings you eat. For example, if you eat two servings, the amount of fat you eat will be twice what is listed on the label. Shopping  Buy low-fat or nonfat foods, such as: Fresh, frozen, or canned fruits and vegetables. Grains, including pasta, bread, and rice. Lean meat, poultry, fish, and other protein foods. Low-fat or nonfat dairy. Avoid buying bakery products and other sweets made with whole milk, butter, and eggs. Avoid buying snack foods with added fat, such as anything with butter or cheese flavoring. Cooking Remove skin from poultry, and remove extra fat from meat. Limit the amount of fat and oil you use to 6 tsp (30 mL) or less per day. Cook using low-fat methods, such as boiling, broiling, grilling, steaming, or baking. Use spray oil to cook. Add fat-free chicken broth to add flavor and moisture. Avoid adding cream to thicken soups or sauces. Use  other thickeners such as corn starch or tomato paste. Meal planning  Eat a low-fat diet as told by your dietitian. For most people, this means having no more than 55-65 g of fat each day. Eat small, frequent meals throughout the day. For example, you may have 5 or 6 small meals instead of 3 large meals. Drink enough fluid to keep your urine pale yellow. Do not drink alcohol. Talk to your health care provider if you need help stopping. Limit how much caffeine you have, including black coffee, black and green tea, soft drinks with caffeine, and energy drinks. Plan to include a variety of foods in your diet. Include fruits, vegetables, whole grains and lean proteins, and low-fat or nonfat dairy. You need a balanced diet for good overall health. General information Let your health care provider or dietitian know if you have unplanned weight loss on this eating plan. You may be told to follow a clear liquid or soft food diet when symptoms come back, which is called a flare. Talk with your health care provider about how to manage your diet during symptoms of a flare. Take any vitamins or supplements as told by your health care provider. You may need to take: Extra vitamins that dissolve in fat (are fat soluble), such as vitamins A, D, E, and K. Nutritional supplements. Work with a Microbiologist, especially if you have other conditions such as obesity, osteoporosis, or diabetes mellitus. Some people need extra treatments, such as: Pills or capsules  to replace enzymes (oral pancreatic enzyme replacement therapy). Feedings through a tube in the stomach or intestine (enteral feedings). What foods should I avoid? Fruits Fried fruits. Fruits served with butter or cream. Vegetables Fried vegetables. Vegetables cooked with butter, cheese, or cream. Grains Biscuits, waffles, donuts, pastries, and croissants. Pies and cookies. Butter-flavored popcorn. Regular crackers. Meats and other proteins Fatty cuts of  meat. Poultry with skin. Organ meats. Precooked or cured meat, such as sausages or meat loaves. Whole eggs. Nuts and nut butters. Dairy Whole and 2% milk. Whole milk yogurt. Whole milk ice cream. Cream and half-and-half. Cheese, such as cream cheese. Sour cream. Beverages Wine, beer, and liquor. The items listed above may not be a complete list of foods and beverages you should avoid. Contact a dietitian for more information. Summary Pancreatitis can affect how your body digests food, especially foods with fat. When you have pancreatitis, it is recommended that you follow a low-fat eating plan to help you recover faster and manage symptoms. Do not drink alcohol. Limit the amount of caffeine you have, and drink enough fluid to keep your urine pale yellow. This information is not intended to replace advice given to you by your health care provider. Make sure you discuss any questions you have with your health care provider. Document Revised: 06/10/2021 Document Reviewed: 06/10/2021 Elsevier Patient Education  [employer].

## 2022-03-22 NOTE — Assessment & Plan Note (Signed)
Reviewed hospital records with patient.  No obvious etiology found - imaging without gallbladder or biliary tree stone, no alcohol use, triglycerides only mildly elevated.  Discussed dietary measures to prevent recurrence, handout provided. Given recurrent symptoms yesterday, check lipase.  Update if recurrent symptoms for GI evaluation. Pt agrees with plan.

## 2022-03-25 DIAGNOSIS — D225 Melanocytic nevi of trunk: Secondary | ICD-10-CM | POA: Diagnosis not present

## 2022-03-25 DIAGNOSIS — Z85828 Personal history of other malignant neoplasm of skin: Secondary | ICD-10-CM | POA: Diagnosis not present

## 2022-03-25 DIAGNOSIS — D2239 Melanocytic nevi of other parts of face: Secondary | ICD-10-CM | POA: Diagnosis not present

## 2022-03-25 DIAGNOSIS — D235 Other benign neoplasm of skin of trunk: Secondary | ICD-10-CM | POA: Diagnosis not present

## 2022-06-19 ENCOUNTER — Other Ambulatory Visit: Payer: Self-pay | Admitting: Family Medicine

## 2022-06-19 DIAGNOSIS — Z8042 Family history of malignant neoplasm of prostate: Secondary | ICD-10-CM

## 2022-06-22 ENCOUNTER — Other Ambulatory Visit: Payer: BLUE CROSS/BLUE SHIELD

## 2022-06-29 ENCOUNTER — Ambulatory Visit (INDEPENDENT_AMBULATORY_CARE_PROVIDER_SITE_OTHER): Payer: BLUE CROSS/BLUE SHIELD | Admitting: Family Medicine

## 2022-06-29 ENCOUNTER — Encounter: Payer: Self-pay | Admitting: Family Medicine

## 2022-06-29 VITALS — BP 124/82 | HR 68 | Temp 97.1°F | Ht 70.5 in | Wt 208.4 lb

## 2022-06-29 DIAGNOSIS — Z8719 Personal history of other diseases of the digestive system: Secondary | ICD-10-CM

## 2022-06-29 DIAGNOSIS — Z85820 Personal history of malignant melanoma of skin: Secondary | ICD-10-CM

## 2022-06-29 DIAGNOSIS — Z0000 Encounter for general adult medical examination without abnormal findings: Secondary | ICD-10-CM

## 2022-06-29 DIAGNOSIS — Z800 Family history of malignant neoplasm of digestive organs: Secondary | ICD-10-CM

## 2022-06-29 DIAGNOSIS — Z8042 Family history of malignant neoplasm of prostate: Secondary | ICD-10-CM | POA: Diagnosis not present

## 2022-06-29 NOTE — Patient Instructions (Addendum)
Call GI to see when you're due for repeat colonoscopy.  Check into shingrix vaccine series.  Good to see you today Return 1 year for next physical.   Health Maintenance, Male Adopting a healthy lifestyle and getting preventive care are important in promoting health and wellness. Ask your health care provider about: The right schedule for you to have regular tests and exams. Things you can do on your own to prevent diseases and keep yourself healthy. What should I know about diet, weight, and exercise? Eat a healthy diet  Eat a diet that includes plenty of vegetables, fruits, low-fat dairy products, and lean protein. Do not eat a lot of foods that are high in solid fats, added sugars, or sodium. Maintain a healthy weight Body mass index (BMI) is a measurement that can be used to identify possible weight problems. It estimates body fat based on height and weight. Your health care provider can help determine your BMI and help you achieve or maintain a healthy weight. Get regular exercise Get regular exercise. This is one of the most important things you can do for your health. Most adults should: Exercise for at least 150 minutes each week. The exercise should increase your heart rate and make you sweat (moderate-intensity exercise). Do strengthening exercises at least twice a week. This is in addition to the moderate-intensity exercise. Spend less time sitting. Even light physical activity can be beneficial. Watch cholesterol and blood lipids Have your blood tested for lipids and cholesterol at 56 years of age, then have this test every 5 years. You may need to have your cholesterol levels checked more often if: Your lipid or cholesterol levels are high. You are older than 56 years of age. You are at high risk for heart disease. What should I know about cancer screening? Many types of cancers can be detected early and may often be prevented. Depending on your health history and family  history, you may need to have cancer screening at various ages. This may include screening for: Colorectal cancer. Prostate cancer. Skin cancer. Lung cancer. What should I know about heart disease, diabetes, and high blood pressure? Blood pressure and heart disease High blood pressure causes heart disease and increases the risk of stroke. This is more likely to develop in people who have high blood pressure readings or are overweight. Talk with your health care provider about your target blood pressure readings. Have your blood pressure checked: Every 3-5 years if you are 1-93 years of age. Every year if you are 42 years old or older. If you are between the ages of 11 and 61 and are a current or former smoker, ask your health care provider if you should have a one-time screening for abdominal aortic aneurysm (AAA). Diabetes Have regular diabetes screenings. This checks your fasting blood sugar level. Have the screening done: Once every three years after age 62 if you are at a normal weight and have a low risk for diabetes. More often and at a younger age if you are overweight or have a high risk for diabetes. What should I know about preventing infection? Hepatitis B If you have a higher risk for hepatitis B, you should be screened for this virus. Talk with your health care provider to find out if you are at risk for hepatitis B infection. Hepatitis C Blood testing is recommended for: Everyone born from [birth year] through 1965. Anyone with known risk factors for hepatitis C. Sexually transmitted infections (STIs) You should be screened each  year for STIs, including gonorrhea and chlamydia, if: You are sexually active and are younger than 56 years of age. You are older than 56 years of age and your health care provider tells you that you are at risk for this type of infection. Your sexual activity has changed since you were last screened, and you are at increased risk for chlamydia or  gonorrhea. Ask your health care provider if you are at risk. Ask your health care provider about whether you are at high risk for HIV. Your health care provider may recommend a prescription medicine to help prevent HIV infection. If you choose to take medicine to prevent HIV, you should first get tested for HIV. You should then be tested every 3 months for as long as you are taking the medicine. Follow these instructions at home: Alcohol use Do not drink alcohol if your health care provider tells you not to drink. If you drink alcohol: Limit how much you have to 0-2 drinks a day. Know how much alcohol is in your drink. In the [place] one drink equals one 12 oz bottle of beer (355 mL), one 5 oz glass of wine (148 mL), or one 1 oz glass of hard liquor (44 mL). Lifestyle Do not use any products that contain nicotine or tobacco. These products include cigarettes, chewing tobacco, and vaping devices, such as e-cigarettes. If you need help quitting, ask your health care provider. Do not use street drugs. Do not share needles. Ask your health care provider for help if you need support or information about quitting drugs. General instructions Schedule regular health, dental, and eye exams. Stay current with your vaccines. Tell your health care provider if: You often feel depressed. You have ever been abused or do not feel safe at home. Summary Adopting a healthy lifestyle and getting preventive care are important in promoting health and wellness. Follow your health care provider's instructions about healthy diet, exercising, and getting tested or screened for diseases. Follow your health care provider's instructions on monitoring your cholesterol and blood pressure. This information is not intended to replace advice given to you by your health care provider. Make sure you discuss any questions you have with your health care provider. Document Revised: 11/10/2020 Document Reviewed: 11/10/2020 Elsevier  Patient Education  [employer].

## 2022-06-29 NOTE — Assessment & Plan Note (Addendum)
No recurrent symptoms.  Avoiding alcohol and ice cream

## 2022-06-29 NOTE — Assessment & Plan Note (Signed)
Overdue for colonoscopy - he will contact GI to repeat.

## 2022-06-29 NOTE — Assessment & Plan Note (Signed)
Preventative protocols reviewed and updated unless pt declined. Discussed healthy diet and lifestyle.  

## 2022-06-29 NOTE — Progress Notes (Signed)
Patient ID: Clarence Moore, male    DOB: 1965-11-30, 56 y.o.   MRN: [id number]  This visit was conducted in person.  BP 124/82   Pulse 68   Temp (!) 97.1 F (36.2 C) (Temporal)   Ht 5' 10.5" (1.791 m)   Wt 208 lb 6.4 oz (94.5 kg)   SpO2 97%   BMI 29.48 kg/m    CC: CPE Subjective:   HPI: Clarence Moore is a 56 y.o. male presenting on 06/29/2022 for Annual Exam   Hospitalization earlier this year for pancreatitis. No found etiology for pancreatitis. No recurrent symptoms. He's decreased alcohol (wasn't significantly drinking) and ice cream.   Preventative: COLONOSCOPY 08/2014; HP, int hem, diverticulosis, rec rpt 5 yrs ([facility] @ [place])  Prostate cancer - family history (grandfather). He notes some urinary hesitancy - started taking prostaguard supplement with benefit.  Lung cancer screening - not eligible  Flu shot - declines  COVID vaccine - declines Tdap 2016  Shingrix - discussed, declines, will research this.  Seat belt use discussed  Sunscreen use discussed. Denies new changing moles on skin. Sees derm regularly for h/o melanoma x2.  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 Interim medical history since our last visit reviewed. Allergies and medications reviewed and updated. Outpatient Medications Prior to Visit  Medication Sig Dispense Refill   cetirizine (ZYRTEC) 10 MG tablet Take 10 mg by mouth daily as needed for allergies.      Multiple Vitamins-Minerals (MULTIVITAMIN ADULT EXTRA C) CHEW Chew 1 tablet by mouth daily.     No  facility-administered medications prior to visit.     Per HPI unless specifically indicated in ROS section below Review of Systems  Constitutional:  Negative for activity change, appetite change, chills, fatigue, fever and unexpected weight change.  HENT:  Negative for hearing loss.   Eyes:  Negative for visual disturbance.  Respiratory:  Negative for cough, chest tightness, shortness of breath and wheezing.   Cardiovascular:  Negative for chest pain, palpitations and leg swelling.  Gastrointestinal:  Negative for abdominal distention, abdominal pain, blood in stool, constipation, diarrhea, nausea and vomiting.  Genitourinary:  Negative for difficulty urinating and hematuria.  Musculoskeletal:  Negative for arthralgias, myalgias and neck pain.  Skin:  Negative for rash.  Neurological:  Negative for dizziness, seizures, syncope and headaches.  Hematological:  Negative for adenopathy. Does not bruise/bleed easily.  Psychiatric/Behavioral:  Negative for dysphoric mood. The patient is not nervous/anxious.     Objective:  BP 124/82   Pulse 68   Temp (!) 97.1 F (36.2 C) (Temporal)   Ht 5' 10.5" (1.791 m)   Wt 208 lb 6.4 oz (94.5 kg)   SpO2 97%   BMI 29.48 kg/m   Wt Readings from Last 3 Encounters:  06/29/22 208 lb 6.4 oz (94.5 kg)  03/22/22 205 lb 4 oz (93.1 kg)  03/03/21 207 lb 1 oz (93.9 kg)      Physical Exam Vitals and nursing note reviewed.  Constitutional:      General: He is not in acute distress.  Appearance: Normal appearance. He is well-developed. He is not ill-appearing.  HENT:     Head: Normocephalic and atraumatic.     Right Ear: Hearing, tympanic membrane, ear canal and external ear normal.     Left Ear: Hearing, tympanic membrane, ear canal and external ear normal.     Nose: Nose normal.     Mouth/Throat:     Mouth: Mucous membranes are moist.     Pharynx: Oropharynx is clear. No oropharyngeal exudate or posterior oropharyngeal erythema.  Eyes:      General: No scleral icterus.    Extraocular Movements: Extraocular movements intact.     Conjunctiva/sclera: Conjunctivae normal.     Pupils: Pupils are equal, round, and reactive to light.  Neck:     Thyroid: No thyroid mass or thyromegaly.  Cardiovascular:     Rate and Rhythm: Normal rate and regular rhythm.     Pulses: Normal pulses.          Radial pulses are 2+ on the right side and 2+ on the left side.     Heart sounds: Normal heart sounds. No murmur heard. Pulmonary:     Effort: Pulmonary effort is normal. No respiratory distress.     Breath sounds: Normal breath sounds. No wheezing, rhonchi or rales.  Abdominal:     General: Bowel sounds are normal. There is no distension.     Palpations: Abdomen is soft. There is no mass.     Tenderness: There is no abdominal tenderness. There is no guarding or rebound.     Hernia: No hernia is present.  Musculoskeletal:        General: Normal range of motion.     Cervical back: Normal range of motion and neck supple.     Right lower leg: No edema.     Left lower leg: No edema.  Lymphadenopathy:     Cervical: No cervical adenopathy.  Skin:    General: Skin is warm and dry.     Findings: No rash.  Neurological:     General: No focal deficit present.     Mental Status: He is alert and oriented to person, place, and time.  Psychiatric:        Mood and Affect: Mood normal.        Behavior: Behavior normal.        Thought Content: Thought content normal.        Judgment: Judgment normal.       Results for orders placed or performed in visit on 03/22/22  Comprehensive metabolic panel  Result Value Ref Range   Sodium 139 135 - 145 mEq/L   Potassium 4.8 3.5 - 5.1 mEq/L   Chloride 103 96 - 112 mEq/L   CO2 26 19 - 32 mEq/L   Glucose, Bld 81 70 - 99 mg/dL   BUN 17 6 - 23 mg/dL   Creatinine, Ser 0.92 0.40 - 1.50 mg/dL   Total Bilirubin 0.8 0.2 - 1.2 mg/dL   Alkaline Phosphatase 103 39 - 117 U/L   AST 15 0 - 37 U/L   ALT 23 0 - 53 U/L    Total Protein 7.5 6.0 - 8.3 g/dL   Albumin 4.4 3.5 - 5.2 g/dL   GFR 92.90 >60.00 mL/min   Calcium 9.7 8.4 - 10.5 mg/dL  Lipid panel  Result Value Ref Range   Cholesterol 152 0 - 200 mg/dL   Triglycerides 80.0 0.0 - 149.0 mg/dL   HDL 47.10 >39.00 mg/dL   VLDL  16.0 0.0 - 40.0 mg/dL   LDL Cholesterol 89 0 - 99 mg/dL   Total CHOL/HDL Ratio 3    NonHDL 105.32   CBC with Differential/Platelet  Result Value Ref Range   WBC 8.3 4.0 - 10.5 K/uL   RBC 4.57 4.22 - 5.81 Mil/uL   Hemoglobin 14.1 13.0 - 17.0 g/dL   HCT 41.2 39.0 - 52.0 %   MCV 90.2 78.0 - 100.0 fl   MCHC 34.2 30.0 - 36.0 g/dL   RDW 12.2 11.5 - 15.5 %   Platelets 424.0 (H) 150.0 - 400.0 K/uL   Neutrophils Relative % 69.7 43.0 - 77.0 %   Lymphocytes Relative 21.6 12.0 - 46.0 %   Monocytes Relative 7.3 3.0 - 12.0 %   Eosinophils Relative 1.0 0.0 - 5.0 %   Basophils Relative 0.4 0.0 - 3.0 %   Neutro Abs 5.8 1.4 - 7.7 K/uL   Lymphs Abs 1.8 0.7 - 4.0 K/uL   Monocytes Absolute 0.6 0.1 - 1.0 K/uL   Eosinophils Absolute 0.1 0.0 - 0.7 K/uL   Basophils Absolute 0.0 0.0 - 0.1 K/uL  PSA  Result Value Ref Range   PSA 2.48 0.10 - 4.00 ng/mL  Lipase  Result Value Ref Range   Lipase 830.0 (H) 11.0 - 59.0 U/L    Assessment & Plan:   Problem List Items Addressed This Visit     Healthcare maintenance - Primary (Chronic)    Preventative protocols reviewed and updated unless pt declined. Discussed healthy diet and lifestyle.       Family history of colon cancer    Overdue for colonoscopy - he will contact GI to repeat.       Family history of prostate cancer    PSA levels stable. Declines DRE.       History of malignant melanoma    Has had melanoma x2, seeing derm Q3 months.       History of acute pancreatitis    No recurrent symptoms.  Avoiding alcohol and ice cream         No orders of the defined types were placed in this encounter.  No orders of the defined types were placed in this encounter.    Patient  instructions: Call GI to see when you're due for repeat colonoscopy.  Check into shingrix vaccine series.  Good to see you today Return 1 year for next physical.   Follow up plan: Return in about 1 year (around 06/30/2023) for annual exam, prior fasting for blood work.  Ria Bush, MD

## 2022-06-29 NOTE — Assessment & Plan Note (Signed)
PSA levels stable. Declines DRE.

## 2022-06-29 NOTE — Assessment & Plan Note (Signed)
Has had melanoma x2, seeing derm Q3 months.

## 2022-07-06 DIAGNOSIS — D2271 Melanocytic nevi of right lower limb, including hip: Secondary | ICD-10-CM | POA: Diagnosis not present

## 2022-07-06 DIAGNOSIS — D235 Other benign neoplasm of skin of trunk: Secondary | ICD-10-CM | POA: Diagnosis not present

## 2022-07-06 DIAGNOSIS — E663 Overweight: Secondary | ICD-10-CM | POA: Diagnosis not present

## 2022-07-06 DIAGNOSIS — Z85820 Personal history of malignant melanoma of skin: Secondary | ICD-10-CM | POA: Diagnosis not present

## 2022-07-06 DIAGNOSIS — D485 Neoplasm of uncertain behavior of skin: Secondary | ICD-10-CM | POA: Diagnosis not present

## 2022-07-06 DIAGNOSIS — D225 Melanocytic nevi of trunk: Secondary | ICD-10-CM | POA: Diagnosis not present

## 2023-01-04 DIAGNOSIS — D235 Other benign neoplasm of skin of trunk: Secondary | ICD-10-CM | POA: Diagnosis not present

## 2023-01-04 DIAGNOSIS — D225 Melanocytic nevi of trunk: Secondary | ICD-10-CM | POA: Diagnosis not present

## 2023-01-04 DIAGNOSIS — L814 Other melanin hyperpigmentation: Secondary | ICD-10-CM | POA: Diagnosis not present

## 2023-01-04 DIAGNOSIS — X32XXXS Exposure to sunlight, sequela: Secondary | ICD-10-CM | POA: Diagnosis not present

## 2023-06-22 DIAGNOSIS — Z1231 Encounter for screening mammogram for malignant neoplasm of breast: Secondary | ICD-10-CM | POA: Diagnosis not present

## 2023-06-23 ENCOUNTER — Ambulatory Visit
Admission: EM | Admit: 2023-06-23 | Discharge: 2023-06-23 | Disposition: A | Discharge status: Home / Self Care | Payer: BLUE CROSS/BLUE SHIELD | Attending: Emergency Medicine | Admitting: Emergency Medicine

## 2023-06-23 ENCOUNTER — Telehealth: Payer: Self-pay

## 2023-06-23 DIAGNOSIS — S81819A Laceration without foreign body, unspecified lower leg, initial encounter: Secondary | ICD-10-CM

## 2023-06-23 MED ORDER — SULFAMETHOXAZOLE-TRIMETHOPRIM 800-160 MG PO TABS: 1.0000 | ORAL_TABLET | Freq: Two times a day (BID) | ORAL | 0 refills | Status: AC

## 2023-06-23 NOTE — Telephone Encounter (Signed)
Noted. Agree with urgent care eval. Thank you. Looks like he's arrived there.

## 2023-06-23 NOTE — Telephone Encounter (Signed)
Agree with urgent care evaluation

## 2023-06-23 NOTE — ED Triage Notes (Signed)
Pt presents with laceration to right shin/leg. Pt states he cut it on a tailgate today.

## 2023-06-23 NOTE — Discharge Instructions (Addendum)
Your stitches need to be taken out in 7 to 10 days.    Please consider getting a tetanus vaccination.    Follow-up right away if you see signs of infection such as pus, redness, fever.  See the attached handout on laceration care.

## 2023-06-23 NOTE — Telephone Encounter (Signed)
Pt walked in; just few mins ago pt jumped up on tailgate of truck and cut lower right leg; pt has laceration on front of rt lower leg; pt has already placed steri strip and wrapped area. Pt said needs couple stitiches; pt said bled a lot but not bleeding now. Last Tdap 07/19/2014. No avalable appts at [facility]. Did not remove steri strip but re wrapped leg with gauze and pt is going to [employer] now. Pt was in hurry and left without vitals. Sending note to Dr Reece Agar who is out of office and Audria Nine NP who is in office.

## 2023-06-23 NOTE — ED Provider Notes (Signed)
Clarence Moore    CSN: [id number] Arrival date & time: 06/23/23  1210      History   Chief Complaint Chief Complaint  Patient presents with   Laceration    HPI Clarence Moore is a 57 y.o. male.  Patient presents with a laceration on his right shin that occurred when he accidentally cut it on a tailgate this morning.  Bleeding controlled with pressure and bandage applied.  No numbness or weakness.  Last tetanus 2016.  The history is provided by the patient and medical records.    Past Medical History:  Diagnosis Date   Acute pancreatitis without infection or necrosis 03/22/2022   ED (erectile dysfunction)    per uro   Left ureteral calculus 11/2012   9mm, s/p stent placement Margarita Grizzle)   Malignant melanoma of lower leg, right (HCC) 02/03/2020   01/2020 (Dr Katrinka Blazing) - referred to Dr Jennet Maduro for wide excision with sentinel lymph node biopsy    Patient Active Problem List   Diagnosis Date Noted   History of acute pancreatitis 03/22/2022   History of malignant melanoma 02/03/2020   Overweight 08/25/2015   Multiple nevi 08/25/2015   Healthcare maintenance 03/06/2013   Family history of prostate cancer 03/06/2013   Family history of colon cancer 10/16/2012   History of kidney stones 07/05/2012    Past Surgical History:  Procedure Laterality Date   COLONOSCOPY  2003   WNL ([place])   COLONOSCOPY  08/2014   HP, int hem, diverticulosis, rec rpt 5 yrs ([facility])   CYSTOSCOPY WITH RETROGRADE PYELOGRAM, URETEROSCOPY AND STENT PLACEMENT Left 11/20/2012   Procedure: CYSTOSCOPY WITH RETROGRADE PYELOGRAM, URETEROSCOPY AND STENT PLACEMENT;  Surgeon: Milford Cage, MD;  Location: [facility];  Service: Urology;  Laterality: Left;  CYSTO, (L) URETEROSCOPY, LASER LITHO, LEFT RETROGRADE PYELOGRAM, POSSIBLE LEFT URETER STENT,     HOLMIUM LASER APPLICATION Left 11/20/2012   Procedure: HOLMIUM LASER APPLICATION;  Surgeon: Milford Cage, MD;   Location: [facility];  Service: Urology;  Laterality: Left;   LITHOTRIPSY  11/2012   Margarita Grizzle   NASAL TURBINATE REDUCTION  2009   TONSILLECTOMY AND ADENOIDECTOMY  AGE 89       Home Medications    Prior to Admission medications   Medication Sig Start Date End Date Taking? Authorizing Provider  sulfamethoxazole-trimethoprim (BACTRIM DS) 800-160 MG tablet Take 1 tablet by mouth 2 (two) times daily for 7 days. 06/23/23 06/30/23 Yes Mickie Bail, NP  cetirizine (ZYRTEC) 10 MG tablet Take 10 mg by mouth daily as needed for allergies.     [provider]  Multiple Vitamins-Minerals (MULTIVITAMIN ADULT EXTRA C) CHEW Chew 1 tablet by mouth daily.    [provider]    Family History Family History  Problem Relation Age of Onset   Cancer Paternal Grandmother 49       colon   Colon polyps Paternal Grandfather    Alzheimer's disease Paternal Grandfather    Cancer Paternal Grandfather        prostate   Colon polyps Father    Cancer Father        skin/esophageal   Other Father 27       prostate issues   Cancer Mother 61       lung, non small cell (smoker)   Diabetes Neg Hx    CAD Neg Hx    Stroke Neg Hx     Social History Social History   Tobacco Use  Smoking status: Never   Smokeless tobacco: Never  Substance Use Topics   Alcohol use: No   Drug use: No     Allergies   Penicillins   Review of Systems Review of Systems  Constitutional:  Negative for chills and fever.  Musculoskeletal:  Negative for arthralgias, gait problem and joint swelling.  Skin:  Positive for wound. Negative for color change.  Neurological:  Negative for weakness and numbness.     Physical Exam Triage Vital Signs ED Triage Vitals  Encounter Vitals Group     BP 06/23/23 1232 (!) 134/92     Systolic BP Percentile --      Diastolic BP Percentile --      Pulse Rate 06/23/23 1223 69     Resp 06/23/23 1223 18     Temp 06/23/23 1223 97.9 F (36.6 C)      Temp src --      SpO2 06/23/23 1223 97 %     Weight --      Height --      Head Circumference --      Peak Flow --      Pain Score 06/23/23 1238 0     Pain Loc --      Pain Education --      Exclude from Growth Chart --    No data found.  Updated Vital Signs BP (!) 134/92   Pulse 69   Temp 97.9 F (36.6 C)   Resp 18   SpO2 97%   Visual Acuity Right Eye Distance:   Left Eye Distance:   Bilateral Distance:    Right Eye Near:   Left Eye Near:    Bilateral Near:     Physical Exam Constitutional:      General: He is not in acute distress. HENT:     Mouth/Throat:     Mouth: Mucous membranes are moist.  Cardiovascular:     Rate and Rhythm: Normal rate and regular rhythm.  Pulmonary:     Effort: Pulmonary effort is normal. No respiratory distress.  Musculoskeletal:        General: No deformity. Normal range of motion.  Skin:    General: Skin is warm and dry.     Findings: Lesion present. No erythema.     Comments: Right shin: 6 cm laceration with 1 cm circular open puncture wound at base.  Bleeding controlled.    Neurological:     General: No focal deficit present.     Mental Status: He is alert and oriented to person, place, and time.     Sensory: No sensory deficit.     Motor: No weakness.     Gait: Gait normal.      UC Treatments / Results  Labs (all labs ordered are listed, but only abnormal results are displayed) Labs Reviewed - No data to display  EKG   Radiology No results found.  Procedures Laceration Repair  Date/Time: 06/23/2023 1:19 PM  Performed by: Mickie Bail, NP Authorized by: Mickie Bail, NP   Consent:    Consent obtained:  Verbal   Consent given by:  Patient   Risks discussed:  Infection, pain, poor cosmetic result and poor wound healing Universal protocol:    Procedure explained and questions answered to patient or proxy's satisfaction: yes   Laceration details:    Location:  Leg   Leg location:  R lower leg   Length  (cm):  6   Depth (mm):  4  Pre-procedure details:    Preparation:  Patient was prepped and draped in usual sterile fashion Exploration:    Hemostasis achieved with:  Direct pressure   Imaging outcome: foreign body not noted     Wound exploration: wound explored through full range of motion and entire depth of wound visualized   Treatment:    Area cleansed with:  Povidone-iodine   Amount of cleaning:  Standard   Irrigation solution:  Sterile water   Irrigation method:  Syringe   Visualized foreign bodies/material removed: no   Skin repair:    Repair method:  Sutures   Suture size:  3-0   Suture material:  Nylon   Suture technique:  Simple interrupted   Number of sutures:  6 Approximation:    Approximation:  Loose Repair type:    Repair type:  Simple Post-procedure details:    Dressing:  Antibiotic ointment and non-adherent dressing   Procedure completion:  Tolerated well, no immediate complications  (including critical care time)  Medications Ordered in UC Medications - No data to display  Initial Impression / Assessment and Plan / UC Course  I have reviewed the triage vital signs and the nursing notes.  Pertinent labs & imaging results that were available during my care of the patient were reviewed by me and considered in my medical decision making (see chart for details).    Laceration of right chin.  Patient declined tetanus update today.  6 sutures.  Wound care instructions and signs of infection discussed.  Instructed patient to return here for suture removal in 7 to 10 days.  Instructed him to return right away if he notes signs of infection.  Education on laceration care discussed.  Prophylactically treating with Bactrim.  Patient agrees to plan of care.  Final Clinical Impressions(s) / UC Diagnoses   Final diagnoses:  Laceration of shin     Discharge Instructions      Your stitches need to be taken out in 7 to 10 days.    Please consider getting a tetanus  vaccination.    Follow-up right away if you see signs of infection such as pus, redness, fever.  See the attached handout on laceration care.     ED Prescriptions     Medication Sig Dispense Auth. Provider   sulfamethoxazole-trimethoprim (BACTRIM DS) 800-160 MG tablet Take 1 tablet by mouth 2 (two) times daily for 7 days. 14 tablet Mickie Bail, NP      PDMP not reviewed this encounter.   Mickie Bail, NP 06/23/23 1321

## 2023-06-30 ENCOUNTER — Other Ambulatory Visit: Payer: Self-pay | Admitting: Family Medicine

## 2023-06-30 DIAGNOSIS — Z13220 Encounter for screening for lipoid disorders: Secondary | ICD-10-CM

## 2023-06-30 DIAGNOSIS — Z125 Encounter for screening for malignant neoplasm of prostate: Secondary | ICD-10-CM

## 2023-06-30 DIAGNOSIS — Z131 Encounter for screening for diabetes mellitus: Secondary | ICD-10-CM

## 2023-06-30 DIAGNOSIS — D75839 Thrombocytosis, unspecified: Secondary | ICD-10-CM

## 2023-07-01 ENCOUNTER — Ambulatory Visit
Admission: RE | Admit: 2023-07-01 | Discharge: 2023-07-01 | Disposition: A | Discharge status: Home / Self Care | Payer: BLUE CROSS/BLUE SHIELD | Source: Ambulatory Visit | Attending: Emergency Medicine

## 2023-07-01 ENCOUNTER — Other Ambulatory Visit (INDEPENDENT_AMBULATORY_CARE_PROVIDER_SITE_OTHER): Payer: BLUE CROSS/BLUE SHIELD

## 2023-07-01 VITALS — BP 120/81 | HR 77 | Temp 97.9°F | Resp 18

## 2023-07-01 DIAGNOSIS — T148XXA Other injury of unspecified body region, initial encounter: Secondary | ICD-10-CM

## 2023-07-01 DIAGNOSIS — Z13220 Encounter for screening for lipoid disorders: Secondary | ICD-10-CM

## 2023-07-01 DIAGNOSIS — Z5189 Encounter for other specified aftercare: Secondary | ICD-10-CM

## 2023-07-01 DIAGNOSIS — Z125 Encounter for screening for malignant neoplasm of prostate: Secondary | ICD-10-CM | POA: Diagnosis not present

## 2023-07-01 DIAGNOSIS — D75839 Thrombocytosis, unspecified: Secondary | ICD-10-CM

## 2023-07-01 DIAGNOSIS — Z136 Encounter for screening for cardiovascular disorders: Secondary | ICD-10-CM

## 2023-07-01 DIAGNOSIS — L089 Local infection of the skin and subcutaneous tissue, unspecified: Secondary | ICD-10-CM | POA: Diagnosis not present

## 2023-07-01 DIAGNOSIS — Z4802 Encounter for removal of sutures: Secondary | ICD-10-CM

## 2023-07-01 DIAGNOSIS — Z131 Encounter for screening for diabetes mellitus: Secondary | ICD-10-CM

## 2023-07-01 LAB — CBC WITH DIFFERENTIAL/PLATELET
Basophils Absolute: 0 10*3/uL (ref 0.0–0.1)
Basophils Relative: 0.6 % (ref 0.0–3.0)
Eosinophils Absolute: 0.2 10*3/uL (ref 0.0–0.7)
Eosinophils Relative: 3.8 % (ref 0.0–5.0)
HCT: 42.8 % (ref 39.0–52.0)
Hemoglobin: 14.5 g/dL (ref 13.0–17.0)
Lymphocytes Relative: 22 % (ref 12.0–46.0)
Lymphs Abs: 1.2 10*3/uL (ref 0.7–4.0)
MCHC: 34 g/dL (ref 30.0–36.0)
MCV: 91.8 fL (ref 78.0–100.0)
Monocytes Absolute: 0.5 10*3/uL (ref 0.1–1.0)
Monocytes Relative: 9.1 % (ref 3.0–12.0)
Neutro Abs: 3.6 10*3/uL (ref 1.4–7.7)
Neutrophils Relative %: 64.5 % (ref 43.0–77.0)
Platelets: 304 10*3/uL (ref 150.0–400.0)
RBC: 4.66 Mil/uL (ref 4.22–5.81)
RDW: 12.9 % (ref 11.5–15.5)
WBC: 5.5 10*3/uL (ref 4.0–10.5)

## 2023-07-01 LAB — BASIC METABOLIC PANEL
BUN: 20 mg/dL (ref 6–23)
CO2: 29 meq/L (ref 19–32)
Calcium: 9.3 mg/dL (ref 8.4–10.5)
Chloride: 101 meq/L (ref 96–112)
Creatinine, Ser: 1.08 mg/dL (ref 0.40–1.50)
GFR: 75.95 mL/min (ref >60.00)
Glucose, Bld: 97 mg/dL (ref 70–99)
Potassium: 4.3 meq/L (ref 3.5–5.1)
Sodium: 136 meq/L (ref 135–145)

## 2023-07-01 LAB — LIPID PANEL
Cholesterol: 185 mg/dL (ref 0–200)
HDL: 49.6 mg/dL (ref >39.00)
LDL Cholesterol: 115 mg/dL — ABNORMAL HIGH (ref 0–99)
NonHDL: 135.08
Total CHOL/HDL Ratio: 4
Triglycerides: 102 mg/dL (ref 0.0–149.0)
VLDL: 20.4 mg/dL (ref 0.0–40.0)

## 2023-07-01 LAB — PSA: PSA: 2.64 ng/mL (ref 0.10–4.00)

## 2023-07-01 MED ORDER — DOXYCYCLINE HYCLATE 100 MG PO CAPS: 100.0000 mg | ORAL_CAPSULE | Freq: Two times a day (BID) | ORAL | 0 refills | Status: DC

## 2023-07-01 NOTE — ED Provider Notes (Signed)
Clarence Moore    CSN: [id number] Arrival date & time: 07/01/23  0935      History   Chief Complaint Chief Complaint  Patient presents with   Suture / Staple Removal    HPI Clarence Moore is a 57 y.o. male.  Patient presents for wound recheck and removal of sutures.  He received sutures here on 06/23/2023 for an accidental laceration of his right shin.  He reports the wound has been leaking clear drainage up until a day or 2 ago.  The wound has redness around it but he reports this has improved as well.  He denies purulent drainage or fever.  He has been taking the Bactrim as prescribed.  He has been cleaning the wound with Betadine.  The history is provided by the patient and medical records.    Past Medical History:  Diagnosis Date   Acute pancreatitis without infection or necrosis 03/22/2022   ED (erectile dysfunction)    per uro   Left ureteral calculus 11/2012   9mm, s/p stent placement Clarence Moore)   Malignant melanoma of lower leg, right (HCC) 02/03/2020   01/2020 (Dr Katrinka Blazing) - referred to Dr Jennet Maduro for wide excision with sentinel lymph node biopsy    Patient Active Problem List   Diagnosis Date Noted   History of acute pancreatitis 03/22/2022   History of malignant melanoma 02/03/2020   Overweight 08/25/2015   Multiple nevi 08/25/2015   Healthcare maintenance 03/06/2013   Family history of prostate cancer 03/06/2013   Family history of colon cancer 10/16/2012   History of kidney stones 07/05/2012    Past Surgical History:  Procedure Laterality Date   COLONOSCOPY  2003   WNL ([place])   COLONOSCOPY  08/2014   HP, int hem, diverticulosis, rec rpt 5 yrs ([facility])   CYSTOSCOPY WITH RETROGRADE PYELOGRAM, URETEROSCOPY AND STENT PLACEMENT Left 11/20/2012   Procedure: CYSTOSCOPY WITH RETROGRADE PYELOGRAM, URETEROSCOPY AND STENT PLACEMENT;  Surgeon: Milford Cage, MD;  Location: [facility];  Service: Urology;  Laterality: Left;   CYSTO, (L) URETEROSCOPY, LASER LITHO, LEFT RETROGRADE PYELOGRAM, POSSIBLE LEFT URETER STENT,     HOLMIUM LASER APPLICATION Left 11/20/2012   Procedure: HOLMIUM LASER APPLICATION;  Surgeon: Milford Cage, MD;  Location: [facility];  Service: Urology;  Laterality: Left;   LITHOTRIPSY  11/2012   Clarence Moore   NASAL TURBINATE REDUCTION  2009   TONSILLECTOMY AND ADENOIDECTOMY  AGE 32       Home Medications    Prior to Admission medications   Medication Sig Start Date End Date Taking? Authorizing Provider  doxycycline (VIBRAMYCIN) 100 MG capsule Take 1 capsule (100 mg total) by mouth 2 (two) times daily for 7 days. 07/01/23 07/08/23 Yes Mickie Bail, NP  Multiple Vitamins-Minerals (MULTIVITAMIN ADULT EXTRA C) CHEW Chew 1 tablet by mouth daily.   Yes [provider]  cetirizine (ZYRTEC) 10 MG tablet Take 10 mg by mouth daily as needed for allergies.     [provider]    Family History Family History  Problem Relation Age of Onset   Cancer Paternal Grandmother 1       colon   Colon polyps Paternal Grandfather    Alzheimer's disease Paternal Grandfather    Cancer Paternal Grandfather        prostate   Colon polyps Father    Cancer Father        skin/esophageal   Other Father 8       prostate  issues   Cancer Mother 60       lung, non small cell (smoker)   Diabetes Neg Hx    CAD Neg Hx    Stroke Neg Hx     Social History Social History   Tobacco Use   Smoking status: Never   Smokeless tobacco: Never  Substance Use Topics   Alcohol use: No   Drug use: No     Allergies   Penicillins   Review of Systems Review of Systems  Constitutional:  Negative for chills and fever.  Musculoskeletal:  Negative for arthralgias, gait problem and joint swelling.  Skin:  Positive for color change and wound.  Neurological:  Negative for weakness and numbness.     Physical Exam Triage Vital Signs ED Triage Vitals  Encounter Vitals Group      BP      Systolic BP Percentile      Diastolic BP Percentile      Pulse      Resp      Temp      Temp src      SpO2      Weight      Height      Head Circumference      Peak Flow      Pain Score      Pain Loc      Pain Education      Exclude from Growth Chart    No data found.  Updated Vital Signs BP 120/81 (BP Location: Right Arm)   Pulse 77   Temp 97.9 F (36.6 C) (Oral)   Resp 18   SpO2 97%   Visual Acuity Right Eye Distance:   Left Eye Distance:   Bilateral Distance:    Right Eye Near:   Left Eye Near:    Bilateral Near:     Physical Exam Constitutional:      General: He is not in acute distress. HENT:     Mouth/Throat:     Mouth: Mucous membranes are moist.  Cardiovascular:     Rate and Rhythm: Normal rate and regular rhythm.  Pulmonary:     Effort: Pulmonary effort is normal. No respiratory distress.  Musculoskeletal:        General: No deformity. Normal range of motion.  Skin:    General: Skin is warm and dry.     Capillary Refill: Capillary refill takes less than 2 seconds.     Findings: Erythema and lesion present.  Neurological:     General: No focal deficit present.     Mental Status: He is alert and oriented to person, place, and time.     Sensory: No sensory deficit.     Motor: No weakness.     Gait: Gait normal.      UC Treatments / Results  Labs (all labs ordered are listed, but only abnormal results are displayed) Labs Reviewed - No data to display  EKG   Radiology No results found.  Procedures Procedures (including critical care time)  Medications Ordered in UC Medications - No data to display  Initial Impression / Assessment and Plan / UC Course  I have reviewed the triage vital signs and the nursing notes.  Pertinent labs & imaging results that were available during my care of the patient were reviewed by me and considered in my medical decision making (see chart for details).    Wound infection, visit for wound  check, visit for removal of sutures.  Afebrile and  vital signs are stable.  The wound appears to be mildly infected.  No purulent drainage but redness around the site.  Patient has been on Bactrim.  Starting doxycycline today.  Instructed him to follow-up with his PCP on Monday for recheck of his wound.  Wound care instructions and signs of worsening infection discussed.  ED precautions discussed.  Education provided on wound care and suture removal aftercare.  Patient agrees to plan of care.  Final Clinical Impressions(s) / UC Diagnoses   Final diagnoses:  Wound infection  Visit for wound check  Encounter for removal of sutures     Discharge Instructions      Take the doxycycline as directed.  Follow-up with your primary care provider on Monday for wound recheck.  Go to the emergency room if your symptoms worsen over the weekend.     ED Prescriptions     Medication Sig Dispense Auth. Provider   doxycycline (VIBRAMYCIN) 100 MG capsule Take 1 capsule (100 mg total) by mouth 2 (two) times daily for 7 days. 14 capsule Mickie Bail, NP      PDMP not reviewed this encounter.   Mickie Bail, NP 07/01/23 1009

## 2023-07-01 NOTE — ED Triage Notes (Signed)
Pt presents for suture removal of laceration on right shin.  Laceration is red and swollen, pt states it has been leaking clear/ yellow fluid. Spoke with provider and had her look before stiches removed.

## 2023-07-01 NOTE — Discharge Instructions (Addendum)
Take the doxycycline as directed.  Follow-up with your primary care provider on Monday for wound recheck.  Go to the emergency room if your symptoms worsen over the weekend.

## 2023-07-08 ENCOUNTER — Ambulatory Visit (INDEPENDENT_AMBULATORY_CARE_PROVIDER_SITE_OTHER)
Admission: RE | Admit: 2023-07-08 | Discharge: 2023-07-08 | Disposition: A | Discharge status: Home / Self Care | Payer: Commercial Managed Care - PPO | Source: Ambulatory Visit | Attending: Family Medicine | Admitting: Family Medicine

## 2023-07-08 ENCOUNTER — Encounter: Payer: Self-pay | Admitting: Family Medicine

## 2023-07-08 ENCOUNTER — Ambulatory Visit (INDEPENDENT_AMBULATORY_CARE_PROVIDER_SITE_OTHER): Payer: Commercial Managed Care - PPO | Admitting: Family Medicine

## 2023-07-08 VITALS — BP 120/76 | HR 70 | Temp 97.9°F | Ht 70.75 in | Wt 211.0 lb

## 2023-07-08 DIAGNOSIS — Z0000 Encounter for general adult medical examination without abnormal findings: Secondary | ICD-10-CM

## 2023-07-08 DIAGNOSIS — S81801A Unspecified open wound, right lower leg, initial encounter: Secondary | ICD-10-CM

## 2023-07-08 DIAGNOSIS — Z85820 Personal history of malignant melanoma of skin: Secondary | ICD-10-CM

## 2023-07-08 DIAGNOSIS — Z8042 Family history of malignant neoplasm of prostate: Secondary | ICD-10-CM | POA: Diagnosis not present

## 2023-07-08 DIAGNOSIS — Z800 Family history of malignant neoplasm of digestive organs: Secondary | ICD-10-CM | POA: Diagnosis not present

## 2023-07-08 NOTE — Assessment & Plan Note (Addendum)
 Slow to heal wound to R shin after laceration suffered 06/23/2023.  Has completed 2 courses of abx - first bactrim  then doxycycline .  Significant bony tenderness to palpation along shin - check bone films today - clear on my read, await radiology eval.  He declines updated tetanus booster today.

## 2023-07-08 NOTE — Assessment & Plan Note (Addendum)
 Preventative protocols reviewed and updated unless pt declined. Discussed healthy diet and lifestyle.  Reviewed diet choices to improve cholesterol control.

## 2023-07-08 NOTE — Patient Instructions (Addendum)
 Xray of leg today  You should be due for colonoscopy this year 2025 - contact GI if you don't hear from them.  Good to see yo today  Return as needed or in 1 year for next physical

## 2023-07-08 NOTE — Assessment & Plan Note (Signed)
 PSA stable

## 2023-07-08 NOTE — Assessment & Plan Note (Signed)
Regularly sees dermatology.  

## 2023-07-08 NOTE — Assessment & Plan Note (Signed)
 Planned upcoming colonoscopy 2025

## 2023-07-08 NOTE — Progress Notes (Signed)
 Ph: [phone] Fax: [phone]   Patient ID: Clarence Moore, male    DOB: February 15, 1966, 58 y.o.   MRN: [id number]  This visit was conducted in person.  BP 120/76 (BP Location: Right Arm, Cuff Size: Large)   Pulse 70   Temp 97.9 F (36.6 C) (Oral)   Ht 5' 10.75 (1.797 m)   Wt 211 lb (95.7 kg)   SpO2 98%   BMI 29.64 kg/m   BP Readings from Last 3 Encounters:  07/08/23 120/76  07/01/23 120/81  06/23/23 (!) 134/92    CC: CPE Subjective:   HPI: Clarence Moore is a 58 y.o. male presenting on 07/08/2023 for Annual Exam (Discuss lab results. /Laceration on right lower leg on 06-23-23. Healing but still swollen. )   Hospitalization 2023 for pancreatitis, unclear etiology.   Seen at [facility] x2 last month for R shin laceration s/p sutures treated initially with bactrim  course, complicated by infection treated with doxycycline  antibiotic. Sutures have been removed. Wound has been leaking clear fluid. Slow improvement process.   Tdap 07/2014.   Preventative: COLONOSCOPY 08/2014; HP, int hem, diverticulosis, rec rpt 5 yrs ([facility] @ [place]) - planning to get done this year  Prostate cancer - family history (grandfather). He notes stream weakening - takes prostaguard supplement (saw palmetto) with benefit. Lung cancer screening - not eligible  Flu shot - declines  COVID vaccine - declines Tdap 2016  Shingrix - discussed, declines.  Seat belt use discussed  Sunscreen use discussed. Denies new changing moles on skin. Sees derm q62mo for h/o melanoma x2.  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 Interim medical history since our last visit reviewed. Allergies and medications reviewed and updated. Outpatient Medications Prior to Visit  Medication Sig Dispense Refill   cetirizine (ZYRTEC) 10 MG tablet Take 10 mg by mouth daily as needed for allergies.      Multiple Vitamins-Minerals (MULTIVITAMIN ADULT EXTRA C) CHEW Chew 1 tablet by mouth daily.     doxycycline  (VIBRAMYCIN ) 100 MG capsule Take 1 capsule (100 mg total) by mouth 2 (two) times daily for 7 days. 14 capsule 0   No facility-administered medications prior to visit.     Per HPI unless specifically indicated in ROS section below Review of Systems  Constitutional:  Negative for activity change, appetite change, chills, fatigue, fever and unexpected weight change.  HENT:  Negative for hearing loss.   Eyes:  Negative for visual disturbance.  Respiratory:  Negative for cough, chest tightness, shortness of breath and wheezing.   Cardiovascular:  Negative for chest pain, palpitations and leg swelling.  Gastrointestinal:  Negative for abdominal distention, abdominal pain, blood in stool, constipation, diarrhea, nausea and vomiting.  Genitourinary:  Negative for difficulty urinating and hematuria.  Musculoskeletal:  Negative for arthralgias, myalgias and neck pain.  Skin:  Negative for rash.  Neurological:  Negative for dizziness, seizures, syncope and headaches.  Hematological:  Negative for adenopathy. Does not bruise/bleed easily.  Psychiatric/Behavioral:  Negative for dysphoric mood. The patient is not nervous/anxious.     Objective:  BP 120/76 (BP Location: Right Arm, Cuff Size: Large)   Pulse 70   Temp 97.9 F (36.6 C) (Oral)   Ht 5' 10.75 (1.797 m)   Wt 211 lb (95.7 kg)   SpO2 98%   BMI 29.64 kg/m   Wt Readings from Last 3 Encounters:  07/08/23 211 lb (95.7 kg)  06/29/22 208 lb 6.4 oz (94.5 kg)  03/22/22 205 lb 4 oz (93.1 kg)      Physical Exam Vitals and nursing note  reviewed.  Constitutional:      General: He is not in acute distress.    Appearance: Normal appearance. He is well-developed. He is not ill-appearing.  HENT:     Head: Normocephalic and atraumatic.     Right Ear: Hearing, tympanic membrane, ear canal and external ear normal.     Left Ear: Hearing, tympanic membrane, ear canal and external ear normal.     Mouth/Throat:     Mouth: Mucous membranes are moist.     Pharynx: Oropharynx is clear. No oropharyngeal exudate or posterior oropharyngeal erythema.  Eyes:     General: No scleral icterus.    Extraocular Movements: Extraocular movements intact.     Conjunctiva/sclera: Conjunctivae normal.     Pupils: Pupils are equal, round, and reactive to light.  Neck:     Thyroid : No thyroid  mass or thyromegaly.  Cardiovascular:     Rate and Rhythm: Normal rate and regular rhythm.     Pulses: Normal pulses.          Radial pulses are 2+ on the right side and 2+ on the left side.     Heart sounds: Normal heart sounds. No murmur heard. Pulmonary:     Effort: Pulmonary effort is normal. No respiratory distress.     Breath sounds: Normal breath sounds. No wheezing, rhonchi or rales.  Abdominal:     General: Bowel sounds are normal. There is no distension.     Palpations: Abdomen is soft. There is no mass.     Tenderness: There is no abdominal tenderness. There is no guarding or rebound.     Hernia: No hernia is present.  Musculoskeletal:        General: Tenderness present. Normal range of motion.     Cervical back: Normal range of motion and neck supple.     Right lower leg: No edema.     Left lower leg: No edema.     Comments: Bony tenderness to R anterior shin   Lymphadenopathy:     Cervical: No cervical adenopathy.  Skin:    General: Skin is warm and dry.     Findings: Wound present. No rash.          Comments: Slowly healing wound to anterior shin with scab inferior wound, minimal surrounding erythema   Neurological:     General: No  focal deficit present.     Mental Status: He is alert and oriented to person, place, and time.  Psychiatric:        Mood and Affect: Mood normal.        Behavior: Behavior normal.        Thought Content: Thought content normal.        Judgment: Judgment normal.    R shin:     Results for orders placed or performed in visit on 07/01/23  CBC with Differential/Platelet   Collection Time: 07/01/23  8:02 AM  Result Value Ref Range   WBC 5.5 4.0 - 10.5 K/uL  RBC 4.66 4.22 - 5.81 Mil/uL   Hemoglobin 14.5 13.0 - 17.0 g/dL   HCT 57.1 60.9 - 47.9 %   MCV 91.8 78.0 - 100.0 fl   MCHC 34.0 30.0 - 36.0 g/dL   RDW 87.0 88.4 - 84.4 %   Platelets 304.0 150.0 - 400.0 K/uL   Neutrophils Relative % 64.5 43.0 - 77.0 %   Lymphocytes Relative 22.0 12.0 - 46.0 %   Monocytes Relative 9.1 3.0 - 12.0 %   Eosinophils Relative 3.8 0.0 - 5.0 %   Basophils Relative 0.6 0.0 - 3.0 %   Neutro Abs 3.6 1.4 - 7.7 K/uL   Lymphs Abs 1.2 0.7 - 4.0 K/uL   Monocytes Absolute 0.5 0.1 - 1.0 K/uL   Eosinophils Absolute 0.2 0.0 - 0.7 K/uL   Basophils Absolute 0.0 0.0 - 0.1 K/uL  Basic metabolic panel   Collection Time: 07/01/23  8:02 AM  Result Value Ref Range   Sodium 136 135 - 145 mEq/L   Potassium 4.3 3.5 - 5.1 mEq/L   Chloride 101 96 - 112 mEq/L   CO2 29 19 - 32 mEq/L   Glucose, Bld 97 70 - 99 mg/dL   BUN 20 6 - 23 mg/dL   Creatinine, Ser 8.91 0.40 - 1.50 mg/dL   GFR 24.04 >39.99 mL/min   Calcium 9.3 8.4 - 10.5 mg/dL  PSA   Collection Time: 07/01/23  8:02 AM  Result Value Ref Range   PSA 2.64 0.10 - 4.00 ng/mL  Lipid panel   Collection Time: 07/01/23  8:02 AM  Result Value Ref Range   Cholesterol 185 0 - 200 mg/dL   Triglycerides 897.9 0.0 - 149.0 mg/dL   HDL 50.39 >60.99 mg/dL   VLDL 79.5 0.0 - 59.9 mg/dL   LDL Cholesterol 884 (H) 0 - 99 mg/dL   Total CHOL/HDL Ratio 4    NonHDL 135.08     Assessment & Plan:   Problem List Items Addressed This Visit     Healthcare maintenance - Primary  (Chronic)   Preventative protocols reviewed and updated unless pt declined. Discussed healthy diet and lifestyle.  Reviewed diet choices to improve cholesterol control.       Family history of colon cancer   Planned upcoming colonoscopy 2025       Family history of prostate cancer   PSA stable.       History of malignant melanoma   Regularly sees dermatology.       Leg wound, right, initial encounter   Slow to heal wound to R shin after laceration suffered 06/23/2023.  Has completed 2 courses of abx - first bactrim  then doxycycline .  Significant bony tenderness to palpation along shin - check bone films today - clear on my read, await radiology eval.  He declines updated tetanus booster today.       Relevant Orders   DG Tibia/Fibula Right     No orders of the defined types were placed in this encounter.   Orders Placed This Encounter  Procedures   DG Tibia/Fibula Right    Standing Status:   Future    Number of Occurrences:   1    Expiration Date:   07/07/2024    Reason for Exam (SYMPTOM  OR DIAGNOSIS REQUIRED):   R shin laceration with poorly healing wound, bone pain    Preferred imaging location?:   [facility]    Patient Instructions  Xray of leg today  You should be due for colonoscopy this  year 2025 - contact GI if you don't hear from them.  Good to see yo today  Return as needed or in 1 year for next physical   Follow up plan: Return in about 1 year (around 07/07/2024) for annual exam, prior fasting for blood work.  Anton Blas, MD

## 2024-04-09 ENCOUNTER — Ambulatory Visit: Payer: Self-pay

## 2024-04-09 NOTE — Telephone Encounter (Addendum)
 I saw this message after hours.  Spoke with patient.  Fri after lunch at restaurant (brat/cole slaw) developed nausea with night sweats. No fevers. Turned into LLQ abd pain with radiation to anterior abdomen and lower back. Also with initial constipation but now bowels returning to normal. No blood in stool, no vomiting.   Suspect diverticulitis.  Recommend clear liquid diet over next 24-48 hrs, keep appt tomorrow with Dr Randeen.  Overnight ER precautions reviewed.   Known diverticulosis on last colonoscopy 2019, overdue for f/u colonoscopy.

## 2024-04-09 NOTE — Telephone Encounter (Signed)
 FYI Only or Action Required?: FYI only for provider.  Patient was last seen in primary care on 07/08/2023 by Rilla Baller, MD.  Called Nurse Triage reporting Abdominal Pain.  Symptoms began Friday.  Interventions attempted: OTC medications: ibuprofen and Ice/heat application.  Symptoms are: gradually worsening.  Triage Disposition: See Physician Within 24 Hours  Patient/caregiver understands and will follow disposition?: Yes    Copied from CRM #[id number]. Topic: Clinical - Red Word Triage >> Apr 09, 2024 10:20 AM Dedra NOVAK wrote: Red Word that prompted transfer to Nurse Triage: Pt is experiencing lower abdominal pain. Warm transfer to nurse triage Reason for Disposition  [1] MODERATE pain (e.g., interferes with normal activities) AND [2] pain comes and goes (cramps) AND [3] present > 24 hours  (Exception: Pain with Vomiting or Diarrhea - see that Guideline.)  Answer Assessment - Initial Assessment Questions 1. LOCATION: Where does it hurt?      Lower left abd 2. RADIATION: Does the pain shoot anywhere else? (e.g., chest, back)     At times radiates to back 3. ONSET: When did the pain begin? (Minutes, hours or days ago)      Friday 4. SUDDEN: Gradual or sudden onset?     sudden 5. PATTERN Does the pain come and go, or is it constant?     constant 6. SEVERITY: How bad is the pain?  (e.g., Scale 1-10; mild, moderate, or severe)     3/10 to moderate 8/10 7. RECURRENT SYMPTOM: Have you ever had this type of stomach pain before? If Yes, ask: When was the last time? and What happened that time?      no 8. CAUSE: What do you think is causing the stomach pain? (e.g., gallstones, recent abdominal surgery)     unsure 9. RELIEVING/AGGRAVATING FACTORS: What makes it better or worse? (e.g., antacids, bending or twisting motion, bowel movement)     no 10. OTHER SYMPTOMS: Do you have any other symptoms? (e.g., back pain, diarrhea, fever, urination pain, vomiting)        Fever, chills,  Protocols used: Abdominal Pain - Male-A-AH

## 2024-04-10 ENCOUNTER — Ambulatory Visit: Payer: Self-pay | Admitting: Family Medicine

## 2024-04-10 ENCOUNTER — Encounter: Payer: Self-pay | Admitting: Family Medicine

## 2024-04-10 ENCOUNTER — Ambulatory Visit: Admitting: Family Medicine

## 2024-04-10 VITALS — BP 106/68 | HR 75 | Temp 98.0°F | Ht 70.75 in | Wt 209.0 lb

## 2024-04-10 DIAGNOSIS — Z8719 Personal history of other diseases of the digestive system: Secondary | ICD-10-CM | POA: Diagnosis not present

## 2024-04-10 DIAGNOSIS — R1032 Left lower quadrant pain: Secondary | ICD-10-CM | POA: Insufficient documentation

## 2024-04-10 LAB — CBC WITH DIFFERENTIAL/PLATELET
Basophils Absolute: 0 K/uL (ref 0.0–0.1)
Basophils Relative: 0.4 % (ref 0.0–3.0)
Eosinophils Absolute: 0.1 K/uL (ref 0.0–0.7)
Eosinophils Relative: 1.4 % (ref 0.0–5.0)
HCT: 39.2 % (ref 39.0–52.0)
Hemoglobin: 13.4 g/dL (ref 13.0–17.0)
Lymphocytes Relative: 15.9 % (ref 12.0–46.0)
Lymphs Abs: 1.5 K/uL (ref 0.7–4.0)
MCHC: 34.1 g/dL (ref 30.0–36.0)
MCV: 89 fl (ref 78.0–100.0)
Monocytes Absolute: 0.9 K/uL (ref 0.1–1.0)
Monocytes Relative: 9.7 % (ref 3.0–12.0)
Neutro Abs: 6.9 K/uL (ref 1.4–7.7)
Neutrophils Relative %: 72.6 % (ref 43.0–77.0)
Platelets: 371 K/uL (ref 150.0–400.0)
RBC: 4.4 Mil/uL (ref 4.22–5.81)
RDW: 12.4 % (ref 11.5–15.5)
WBC: 9.5 K/uL (ref 4.0–10.5)

## 2024-04-10 LAB — HEPATIC FUNCTION PANEL
ALT: 19 U/L (ref 0–53)
AST: 18 U/L (ref 0–37)
Albumin: 4.4 g/dL (ref 3.5–5.2)
Alkaline Phosphatase: 80 U/L (ref 39–117)
Bilirubin, Direct: 0.2 mg/dL (ref 0.0–0.3)
Total Bilirubin: 0.7 mg/dL (ref 0.2–1.2)
Total Protein: 7.7 g/dL (ref 6.0–8.3)

## 2024-04-10 LAB — POC URINALSYSI DIPSTICK (AUTOMATED)
Bilirubin, UA: NEGATIVE
Blood, UA: NEGATIVE
Glucose, UA: NEGATIVE
Ketones, UA: NEGATIVE
Leukocytes, UA: NEGATIVE
Nitrite, UA: NEGATIVE
Protein, UA: POSITIVE — AB
Spec Grav, UA: 1.015
Urobilinogen, UA: 0.2 U/dL
pH, UA: 6

## 2024-04-10 LAB — BASIC METABOLIC PANEL WITH GFR
BUN: 16 mg/dL (ref 6–23)
CO2: 29 meq/L (ref 19–32)
Calcium: 9.7 mg/dL (ref 8.4–10.5)
Chloride: 101 meq/L (ref 96–112)
Creatinine, Ser: 0.77 mg/dL (ref 0.40–1.50)
GFR: 98.55 mL/min
Glucose, Bld: 98 mg/dL (ref 70–99)
Potassium: 4.2 meq/L (ref 3.5–5.1)
Sodium: 139 meq/L (ref 135–145)

## 2024-04-10 LAB — LIPASE: Lipase: 9 U/L — ABNORMAL LOW (ref 11.0–59.0)

## 2024-04-10 MED ORDER — CIPROFLOXACIN HCL 500 MG PO TABS: 500.0000 mg | ORAL_TABLET | Freq: Two times a day (BID) | ORAL | 0 refills | Status: DC

## 2024-04-10 MED ORDER — METRONIDAZOLE 500 MG PO TABS: 500.0000 mg | ORAL_TABLET | Freq: Three times a day (TID) | ORAL | 0 refills | Status: AC

## 2024-04-10 NOTE — Progress Notes (Signed)
 Subjective:    Patient ID: Lani JAYSON Molt, male    DOB: 12/01/1965, 58 y.o.   MRN: [id number]  HPI  Wt Readings from Last 3 Encounters:  04/10/24 209 lb (94.8 kg)  07/08/23 211 lb (95.7 kg)  06/29/22 208 lb 6.4 oz (94.5 kg)   29.36 kg/m  Vitals:   04/10/24 1023  BP: 106/68  Pulse: 75  Temp: 98 F (36.7 C)  SpO2: 98%    58 yo pt of Dr KANDICE presents for  Abdominal pain Back pain   On Friday had bratwurst and cole slaw  Symptoms started later that day  Sat /Sunday lots of lower abdominal pain and cramping  No diarrhea  Sunday evening- bms were hard/ pellets   Took a laxative  Gave him loose stool for a day then it stopped   Pain was constant and very deep  Worse on left / very low  Wraps around to the back  No rash   Urine is clear  No blood  This does not feel like kidney stone pain   Did feel feverish a few nights ago  Chills No fever on the thermometer Wakes up in a sweat   Last night Broth Nothing with water since then - feeling a little better     PMH significant for pancreatitis and melanoma (leg) and kidney stone  Also fam history of colon cancer   Pancreatitis noted in 2023  Unknown cause -no alcohol   CT from that time IMPRESSION:  CT ABDOMEN PELVIS  1.  Moderate acute pancreatitis.  Consider correlation with right upper quadrant ultrasound.  2.  Suspected gastroenteritis or mild bowel ileus.  3.  Mild intraperitoneal fluid.  4.  Mild to moderate diffuse gastric wall thickening likely reactive to acute pancreatitis process.  5.  Colonic diverticulosis.  6.  IVC flattening suggesting intravascular volume depletion.   US  at that time IMPRESSION:  1. Hepatic steatosis.  2.  No biliary ductal dilation or cholelithiasis.   Lab Results  Component Value Date   CHOL 185 07/01/2023   HDL 49.60 07/01/2023   LDLCALC 115 (H) 07/01/2023   TRIG 102.0 07/01/2023   CHOLHDL 4 07/01/2023   Lab Results  Component Value Date   WBC 5.5 07/01/2023    HGB 14.5 07/01/2023   HCT 42.8 07/01/2023   MCV 91.8 07/01/2023   PLT 304.0 07/01/2023   Lab Results  Component Value Date   LIPASE 830.0 (H) 03/22/2022    Does not drink alcohol  Little more fat in diet in diet lately      Colonoscopy 10/2019 with 5 y recall   Patient Active Problem List   Diagnosis Date Noted   Abdominal pain, left lower quadrant 04/10/2024   Leg wound, right, initial encounter 07/08/2023   History of acute pancreatitis 03/22/2022   History of malignant melanoma 02/03/2020   Overweight 08/25/2015   Multiple nevi 08/25/2015   Healthcare maintenance 03/06/2013   Family history of prostate cancer 03/06/2013   Family history of colon cancer 10/16/2012   History of kidney stones 07/05/2012   Past Medical History:  Diagnosis Date   Acute pancreatitis without infection or necrosis 03/22/2022   ED (erectile dysfunction)    per uro   Left ureteral calculus 11/2012   9mm, s/p stent placement [facility])   Malignant melanoma of lower leg, right (HCC) 02/03/2020   01/2020 (Dr Claudene) - referred to Dr Moira for wide excision with sentinel lymph node biopsy   Past  Surgical History:  Procedure Laterality Date   COLONOSCOPY  2003   WNL ([place])   COLONOSCOPY  08/2014   HP, int hem, diverticulosis, rec rpt 5 yrs ([facility])   CYSTOSCOPY WITH RETROGRADE PYELOGRAM, URETEROSCOPY AND STENT PLACEMENT Left 11/20/2012   Procedure: CYSTOSCOPY WITH RETROGRADE PYELOGRAM, URETEROSCOPY AND STENT PLACEMENT;  Surgeon: Toribio Neysa Repine, MD;  Location: [facility];  Service: Urology;  Laterality: Left;  CYSTO, (L) URETEROSCOPY, LASER LITHO, LEFT RETROGRADE PYELOGRAM, POSSIBLE LEFT URETER STENT,     HOLMIUM LASER APPLICATION Left 11/20/2012   Procedure: HOLMIUM LASER APPLICATION;  Surgeon: Toribio Neysa Repine, MD;  Location: [facility];  Service: Urology;  Laterality: Left;   LITHOTRIPSY  11/2012   Repine   NASAL TURBINATE REDUCTION   2009   TONSILLECTOMY AND ADENOIDECTOMY  AGE 67   Social History   Tobacco Use   Smoking status: Never   Smokeless tobacco: Never  Substance Use Topics   Alcohol use: No   Drug use: No   Family History  Problem Relation Age of Onset   Cancer Paternal Grandmother 27       colon   Colon polyps Paternal Grandfather    Alzheimer's disease Paternal Grandfather    Cancer Paternal Grandfather        prostate   Colon polyps Father    Cancer Father        skin/esophageal   Other Father 84       prostate issues   Cancer Mother 79       lung, non small cell (smoker)   Diabetes Neg Hx    CAD Neg Hx    Stroke Neg Hx    Allergies  Allergen Reactions   Penicillins Anaphylaxis    Has patient had a PCN reaction causing immediate rash, facial/tongue/throat swelling, SOB or lightheadedness with hypotension: Yes Has patient had a PCN reaction causing severe rash involving mucus membranes or skin necrosis: No Has patient had a PCN reaction that required hospitalization: No Has patient had a PCN reaction occurring within the last 10 years: No If all of the above answers are NO, then may proceed with Cephalosporin use.    Current Outpatient Medications on File Prior to Visit  Medication Sig Dispense Refill   cetirizine (ZYRTEC) 10 MG tablet Take 10 mg by mouth daily as needed for allergies.      Multiple Vitamins-Minerals (MULTIVITAMIN ADULT EXTRA C) CHEW Chew 1 tablet by mouth daily.     No current facility-administered medications on file prior to visit.    Review of Systems  Constitutional:  Negative for activity change, appetite change, fatigue, fever and unexpected weight change.  HENT:  Negative for congestion, rhinorrhea, sore throat and trouble swallowing.   Eyes:  Negative for pain, redness, itching and visual disturbance.  Respiratory:  Negative for cough, chest tightness, shortness of breath and wheezing.   Cardiovascular:  Negative for chest pain and palpitations.   Gastrointestinal:  Positive for abdominal distention and abdominal pain. Negative for anal bleeding, blood in stool, constipation, diarrhea, nausea, rectal pain and vomiting.       Lot of gas  Endocrine: Negative for cold intolerance, heat intolerance, polydipsia and polyuria.  Genitourinary:  Negative for difficulty urinating, dysuria, flank pain, frequency, hematuria and urgency.  Musculoskeletal:  Negative for arthralgias, joint swelling and myalgias.  Skin:  Negative for pallor and rash.  Neurological:  Negative for dizziness, tremors, weakness, numbness and headaches.  Hematological:  Negative for adenopathy. Does not  bruise/bleed easily.  Psychiatric/Behavioral:  Negative for decreased concentration and dysphoric mood. The patient is not nervous/anxious.        Objective:   Physical Exam Constitutional:      General: He is not in acute distress.    Appearance: He is well-developed. He is not ill-appearing or diaphoretic.     Comments: Overweight   HENT:     Head: Normocephalic and atraumatic.  Eyes:     Conjunctiva/sclera: Conjunctivae normal.     Pupils: Pupils are equal, round, and reactive to light.  Neck:     Thyroid : No thyromegaly.     Vascular: No carotid bruit or JVD.  Cardiovascular:     Rate and Rhythm: Normal rate and regular rhythm.     Heart sounds: Normal heart sounds.     No gallop.  Pulmonary:     Effort: Pulmonary effort is normal. No respiratory distress.     Breath sounds: Normal breath sounds. No stridor. No wheezing, rhonchi or rales.  Abdominal:     General: Abdomen is protuberant. Bowel sounds are normal. There is no distension or abdominal bruit. There are no signs of injury.     Palpations: Abdomen is soft. There is no shifting dullness, fluid wave, hepatomegaly, splenomegaly, mass or pulsatile mass.     Tenderness: There is abdominal tenderness in the left lower quadrant. There is no right CVA tenderness, left CVA tenderness, guarding or rebound.  Negative signs include Murphy's sign.     Hernia: No hernia is present.  Musculoskeletal:     Cervical back: Normal range of motion and neck supple.     Right lower leg: No edema.     Left lower leg: No edema.  Lymphadenopathy:     Cervical: No cervical adenopathy.  Skin:    General: Skin is warm and dry.     Coloration: Skin is not pale.     Findings: No rash.  Neurological:     Mental Status: He is alert.     Coordination: Coordination normal.     Deep Tendon Reflexes: Reflexes are normal and symmetric. Reflexes normal.  Psychiatric:        Mood and Affect: Mood normal.           Assessment & Plan:   Problem List Items Addressed This Visit       Other   History of acute pancreatitis   Lipase added to labs       Relevant Orders   Lipase   Abdominal pain, left lower quadrant - Primary   Day 4-5  Severe over weekend/improved now but still present  Slight constipation one day Pt has history of past acute pancreatitis / also melanoma of leg, and diverticuli on CT, kidney stones  , fam history of colon cancer  Reviewed last pcp note, CT repord 2023, us  2023, hosp notes from that time also Exam notes LLQ tenderness w/o rebound / no masses noted  No upper abdominal symptoms or tenderness   Urinalysis  Stat labs   Diverticulitis is in the differential  Will consider imaging and treatment with lab results       Relevant Orders   CBC with Differential/Platelet   Basic metabolic panel with GFR   Hepatic function panel   POCT Urinalysis Dipstick (Automated)

## 2024-04-10 NOTE — Assessment & Plan Note (Signed)
Lipase added to labs.

## 2024-04-10 NOTE — Assessment & Plan Note (Addendum)
 Day 4-5  Severe over weekend/improved now but still present  Slight constipation one day Pt has history of past acute pancreatitis / also melanoma of leg, and diverticuli on CT, kidney stones  , fam history of colon cancer  Reviewed last pcp note, CT repord 2023, us  2023, hosp notes from that time also Exam notes LLQ tenderness w/o rebound / no masses noted  No upper abdominal symptoms or tenderness   Urinalysis  Stat labs   Diverticulitis is in the differential  Will consider imaging and treatment with lab results

## 2024-04-10 NOTE — Patient Instructions (Signed)
 Continue heat as needed  If pain suddenly gets severe -go to the hospital /ER Watch your temperature  Alert us  with no symptoms   Keep hydrated Avoid nuts/seeds / corn and popcorn   Lab today  Urinalysis today   Plan to follow

## 2024-04-12 ENCOUNTER — Ambulatory Visit
Admission: RE | Admit: 2024-04-12 | Discharge: 2024-04-12 | Disposition: A | Discharge status: Home / Self Care | Source: Ambulatory Visit | Attending: Family Medicine | Admitting: Family Medicine

## 2024-04-12 DIAGNOSIS — R1032 Left lower quadrant pain: Secondary | ICD-10-CM

## 2024-04-12 MED ORDER — SULFAMETHOXAZOLE-TRIMETHOPRIM 800-160 MG PO TABS: 1.0000 | ORAL_TABLET | Freq: Two times a day (BID) | ORAL | 0 refills | Status: DC

## 2024-04-12 MED ORDER — IOPAMIDOL (ISOVUE-300) INJECTION 61%
100.0000 mL | Freq: Once | INTRAVENOUS | Status: AC | PRN
Start: 2024-04-12 — End: 2024-04-12
  Administered 2024-04-12: 100 mL via INTRAVENOUS

## 2024-07-01 ENCOUNTER — Other Ambulatory Visit: Payer: Self-pay | Admitting: Family Medicine

## 2024-07-01 DIAGNOSIS — Z13220 Encounter for screening for lipoid disorders: Secondary | ICD-10-CM

## 2024-07-01 DIAGNOSIS — Z125 Encounter for screening for malignant neoplasm of prostate: Secondary | ICD-10-CM

## 2024-07-02 ENCOUNTER — Other Ambulatory Visit (INDEPENDENT_AMBULATORY_CARE_PROVIDER_SITE_OTHER): Payer: Commercial Managed Care - PPO

## 2024-07-02 DIAGNOSIS — Z136 Encounter for screening for cardiovascular disorders: Secondary | ICD-10-CM | POA: Diagnosis not present

## 2024-07-02 DIAGNOSIS — Z13220 Encounter for screening for lipoid disorders: Secondary | ICD-10-CM

## 2024-07-02 DIAGNOSIS — Z125 Encounter for screening for malignant neoplasm of prostate: Secondary | ICD-10-CM

## 2024-07-02 LAB — BASIC METABOLIC PANEL WITH GFR
BUN: 15 mg/dL (ref 6–23)
CO2: 30 meq/L (ref 19–32)
Calcium: 9.2 mg/dL (ref 8.4–10.5)
Chloride: 102 meq/L (ref 96–112)
Creatinine, Ser: 0.97 mg/dL (ref 0.40–1.50)
GFR: 85.8 mL/min
Glucose, Bld: 90 mg/dL (ref 70–99)
Potassium: 4.3 meq/L (ref 3.5–5.1)
Sodium: 140 meq/L (ref 135–145)

## 2024-07-02 LAB — LIPID PANEL
Cholesterol: 179 mg/dL (ref 28–200)
HDL: 53.2 mg/dL
LDL Cholesterol: 109 mg/dL — ABNORMAL HIGH (ref 10–99)
NonHDL: 125.55
Total CHOL/HDL Ratio: 3
Triglycerides: 84 mg/dL (ref 10.0–149.0)
VLDL: 16.8 mg/dL (ref 0.0–40.0)

## 2024-07-02 LAB — PSA: PSA: 2.62 ng/mL (ref 0.10–4.00)

## 2024-07-04 ENCOUNTER — Ambulatory Visit: Payer: Self-pay | Admitting: Family Medicine

## 2024-07-09 ENCOUNTER — Encounter: Payer: Self-pay | Admitting: Family Medicine

## 2024-07-09 ENCOUNTER — Ambulatory Visit: Payer: Commercial Managed Care - PPO | Admitting: Family Medicine

## 2024-07-09 VITALS — BP 126/78 | HR 57 | Temp 97.8°F | Ht 70.47 in | Wt 212.4 lb

## 2024-07-09 DIAGNOSIS — Z87442 Personal history of urinary calculi: Secondary | ICD-10-CM

## 2024-07-09 DIAGNOSIS — Z800 Family history of malignant neoplasm of digestive organs: Secondary | ICD-10-CM

## 2024-07-09 DIAGNOSIS — Z85820 Personal history of malignant melanoma of skin: Secondary | ICD-10-CM

## 2024-07-09 DIAGNOSIS — E66811 Obesity, class 1: Secondary | ICD-10-CM

## 2024-07-09 DIAGNOSIS — Z0000 Encounter for general adult medical examination without abnormal findings: Secondary | ICD-10-CM

## 2024-07-09 DIAGNOSIS — Z8719 Personal history of other diseases of the digestive system: Secondary | ICD-10-CM | POA: Diagnosis not present

## 2024-07-09 DIAGNOSIS — Z8042 Family history of malignant neoplasm of prostate: Secondary | ICD-10-CM | POA: Diagnosis not present

## 2024-07-09 MED ORDER — SAW PALMETTO 500 MG PO CAPS: 1.0000 | ORAL_CAPSULE | Freq: Every day | ORAL | Status: AC

## 2024-07-09 NOTE — Assessment & Plan Note (Signed)
 Continue q6 mo derm f/u

## 2024-07-09 NOTE — Patient Instructions (Addendum)
 Consider pneumonia and shingles shots  Work on weight.  Good to see you today  Return as needed or in 1 year for next physical

## 2024-07-09 NOTE — Progress Notes (Signed)
 " Ph: [phone] Fax: [phone]   Patient ID: Clarence Moore, male    DOB: 04-17-1966, 59 y.o.   MRN: [id number]  This visit was conducted in person.  BP 126/78 (BP Location: Left Arm, Patient Position: Sitting, Cuff Size: Normal)   Pulse (!) 57   Temp 97.8 F (36.6 C) (Oral)   Ht 5' 10.47 (1.79 m)   Wt 212 lb 6.4 oz (96.3 kg)   SpO2 98%   BMI 30.07 kg/m    CC: CPE Subjective:   HPI: Clarence Moore is a 59 y.o. male presenting on 07/09/2024 for Annual Exam (Pt has no concerns)   Hospitalization 2023 for pancreatitis, unclear etiology.   Episode of diverticulitis 04/2024 - treated with bactrim  after he had significant reaction to cipro  (joint pain, malaise), known PCN allergy.   Preventative: COLONOSCOPY 08/2014; HP, int hem, diverticulosis, rec rpt 5 yrs ([facility] @ [place]) - planning to get done this year. Fmhx colon cancer (paternal grandmother) Prostate cancer - family history (grandfather). He notes stream weakening - takes prostaguard supplement (saw palmetto ) with benefit. Lung cancer screening - not eligible  Flu shot - declines  COVID vaccine - declines Tdap 2016  Prevnar-20 - discussed, declines Shingrix - discussed, declines.  Seat belt use discussed  Sunscreen use discussed. Denies new changing moles on skin. Sees derm q78mo for h/o melanoma x2.  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 Interim medical history since our last visit reviewed. Allergies and medications reviewed and updated. Outpatient  Medications Prior to Visit  Medication Sig Dispense Refill   cetirizine (ZYRTEC) 10 MG tablet Take 10 mg by mouth daily as needed for allergies.      Multiple Vitamins-Minerals (MULTIVITAMIN ADULT EXTRA C) CHEW Chew 1 tablet by mouth daily.     sulfamethoxazole -trimethoprim  (BACTRIM  DS) 800-160 MG tablet Take 1 tablet by mouth 2 (two) times daily. (Patient not taking: Reported on 07/09/2024) 20 tablet 0   No facility-administered medications prior to visit.     Per HPI unless specifically indicated in ROS section below Review of Systems  Constitutional:  Negative for activity change, appetite change, chills, fatigue, fever and unexpected weight change.  HENT:  Negative for hearing loss.   Eyes:  Negative for visual disturbance.  Respiratory:  Negative for cough, chest tightness, shortness of breath and wheezing.   Cardiovascular:  Negative for chest pain, palpitations and leg swelling.  Gastrointestinal:  Negative for abdominal distention, abdominal pain, blood in stool, constipation, diarrhea, nausea and vomiting.  Genitourinary:  Negative for difficulty urinating and hematuria.  Musculoskeletal:  Negative for arthralgias, myalgias and neck pain.  Skin:  Negative for rash.  Neurological:  Negative for dizziness, seizures, syncope and headaches.  Hematological:  Negative for adenopathy. Does not bruise/bleed easily.  Psychiatric/Behavioral:  Negative for dysphoric mood. The patient is not nervous/anxious.     Objective:  BP 126/78 (BP Location: Left Arm, Patient Position: Sitting, Cuff Size: Normal)   Pulse (!) 57   Temp 97.8 F (36.6 C) (Oral)   Ht  5' 10.47 (1.79 m)   Wt 212 lb 6.4 oz (96.3 kg)   SpO2 98%   BMI 30.07 kg/m   Wt Readings from Last 3 Encounters:  07/09/24 212 lb 6.4 oz (96.3 kg)  04/10/24 209 lb (94.8 kg)  07/08/23 211 lb (95.7 kg)      Physical Exam Vitals and nursing note reviewed.  Constitutional:      General: He is not in acute distress.    Appearance:  Normal appearance. He is well-developed. He is not ill-appearing.  HENT:     Head: Normocephalic and atraumatic.     Right Ear: Hearing, tympanic membrane, ear canal and external ear normal.     Left Ear: Hearing, tympanic membrane, ear canal and external ear normal.     Mouth/Throat:     Mouth: Mucous membranes are moist.     Pharynx: Oropharynx is clear. No oropharyngeal exudate or posterior oropharyngeal erythema.  Eyes:     General: No scleral icterus.    Extraocular Movements: Extraocular movements intact.     Conjunctiva/sclera: Conjunctivae normal.     Pupils: Pupils are equal, round, and reactive to light.  Neck:     Thyroid : No thyroid  mass or thyromegaly.  Cardiovascular:     Rate and Rhythm: Normal rate and regular rhythm.     Pulses: Normal pulses.          Radial pulses are 2+ on the right side and 2+ on the left side.     Heart sounds: Normal heart sounds. No murmur heard. Pulmonary:     Effort: Pulmonary effort is normal. No respiratory distress.     Breath sounds: Normal breath sounds. No wheezing, rhonchi or rales.  Abdominal:     General: Bowel sounds are normal. There is no distension.     Palpations: Abdomen is soft. There is no mass.     Tenderness: There is no abdominal tenderness. There is no guarding or rebound.     Hernia: No hernia is present.  Musculoskeletal:        General: Normal range of motion.     Cervical back: Normal range of motion and neck supple.     Right lower leg: No edema.     Left lower leg: No edema.  Lymphadenopathy:     Cervical: No cervical adenopathy.  Skin:    General: Skin is warm and dry.     Findings: No rash.  Neurological:     General: No focal deficit present.     Mental Status: He is alert and oriented to person, place, and time.  Psychiatric:        Mood and Affect: Mood normal.        Behavior: Behavior normal.        Thought Content: Thought content normal.        Judgment: Judgment normal.       Results for  orders placed or performed in visit on 07/02/24  PSA   Collection Time: 07/02/24  8:16 AM  Result Value Ref Range   PSA 2.62 0.10 - 4.00 ng/mL  Basic metabolic panel with GFR   Collection Time: 07/02/24  8:16 AM  Result Value Ref Range   Sodium 140 135 - 145 mEq/L   Potassium 4.3 3.5 - 5.1 mEq/L   Chloride 102 96 - 112 mEq/L   CO2 30 19 - 32 mEq/L   Glucose, Bld 90 70 - 99 mg/dL   BUN 15 6 - 23 mg/dL  Creatinine, Ser 0.97 0.40 - 1.50 mg/dL   GFR 14.19 >39.99 mL/min   Calcium 9.2 8.4 - 10.5 mg/dL  Lipid panel   Collection Time: 07/02/24  8:16 AM  Result Value Ref Range   Cholesterol 179 28 - 200 mg/dL   Triglycerides 15.9 89.9 - 149.0 mg/dL   HDL 46.79 >60.99 mg/dL   VLDL 83.1 0.0 - 59.9 mg/dL   LDL Cholesterol 890 (H) 10 - 99 mg/dL   Total CHOL/HDL Ratio 3    NonHDL 125.55     Assessment & Plan:   Problem List Items Addressed This Visit     Healthcare maintenance - Primary (Chronic)   Preventative protocols reviewed and updated unless pt declined. Discussed healthy diet and lifestyle.       History of kidney stones   Family history of colon cancer   Family history of prostate cancer   Obesity, Class I, BMI 30-34.9   Reviewed diet choices to affect sustainable weight loss.       History of malignant melanoma   Continue q6 mo derm f/u       History of acute pancreatitis   History of colonic diverticulitis     Meds ordered this encounter  Medications   Saw Palmetto  500 MG CAPS    Sig: Take 1 capsule (500 mg total) by mouth daily.    No orders of the defined types were placed in this encounter.   Patient Instructions  Consider pneumonia and shingles shots  Work on weight.  Good to see you today  Return as needed or in 1 year for next physical  Follow up plan: Return in about 1 year (around 07/09/2025) for annual exam, prior fasting for blood work.  Anton Blas, MD   "

## 2024-07-09 NOTE — Assessment & Plan Note (Signed)
 Preventative protocols reviewed and updated unless pt declined. Discussed healthy diet and lifestyle.

## 2024-07-09 NOTE — Assessment & Plan Note (Signed)
 Reviewed diet choices to affect sustainable weight loss.
